# Patient Record
Sex: Female | Born: 1940 | Race: Black or African American | Hispanic: No | Marital: Single | Smoking: Never smoker
Health system: Southern US, Community
[De-identification: ages and names within clinical notes are randomized; demographics above are authoritative.]

## PROBLEM LIST (undated history)

## (undated) DIAGNOSIS — I10 Essential (primary) hypertension: Secondary | ICD-10-CM

## (undated) DIAGNOSIS — E119 Type 2 diabetes mellitus without complications: Secondary | ICD-10-CM

---

## 2004-11-30 ENCOUNTER — Ambulatory Visit: Payer: Self-pay | Admitting: Ophthalmology

## 2004-12-07 ENCOUNTER — Ambulatory Visit: Payer: Self-pay | Admitting: Ophthalmology

## 2005-02-02 ENCOUNTER — Ambulatory Visit: Payer: Self-pay | Admitting: Ophthalmology

## 2005-02-08 ENCOUNTER — Ambulatory Visit: Payer: Self-pay | Admitting: Ophthalmology

## 2006-02-22 ENCOUNTER — Ambulatory Visit: Payer: Self-pay | Admitting: Family Medicine

## 2007-04-17 ENCOUNTER — Ambulatory Visit: Payer: Self-pay | Admitting: Family Medicine

## 2008-04-27 ENCOUNTER — Ambulatory Visit: Payer: Self-pay | Admitting: Family Medicine

## 2008-06-08 ENCOUNTER — Emergency Department: Payer: Self-pay | Admitting: Emergency Medicine

## 2009-01-29 ENCOUNTER — Emergency Department: Payer: Self-pay | Admitting: Emergency Medicine

## 2009-06-11 ENCOUNTER — Ambulatory Visit: Payer: Self-pay | Admitting: Family Medicine

## 2010-07-01 ENCOUNTER — Ambulatory Visit: Payer: Self-pay | Admitting: Family Medicine

## 2011-07-04 ENCOUNTER — Ambulatory Visit: Payer: Self-pay | Admitting: Obstetrics and Gynecology

## 2012-08-20 ENCOUNTER — Ambulatory Visit: Payer: Self-pay | Admitting: Family Medicine

## 2012-11-20 ENCOUNTER — Emergency Department: Payer: Self-pay

## 2012-11-20 ENCOUNTER — Ambulatory Visit: Payer: Self-pay | Admitting: Family Medicine

## 2013-11-11 ENCOUNTER — Ambulatory Visit: Payer: Self-pay | Admitting: Family Medicine

## 2013-12-17 IMAGING — MG MM CAD SCREENING MAMMO
1 series · 4 of 4 positions shown · non-contrast
Comparison: none

REASON FOR EXAM: SCR MAMMO NO ORDER
COMMENTS:

PROCEDURE:     MAM - MAM DGTL SCRN MAM NO ORDER W/CAD  - August 20, 2012  [DATE]
RESULT:     Breast are dense and nodular. No mass. No pathologic clustered
calcification. Exam stable from prior studies dating back to 08/22/2002. CAD
evaluation nonfocal.

[R CC · right · 4 of 4 slices shown]
[im 1/4]
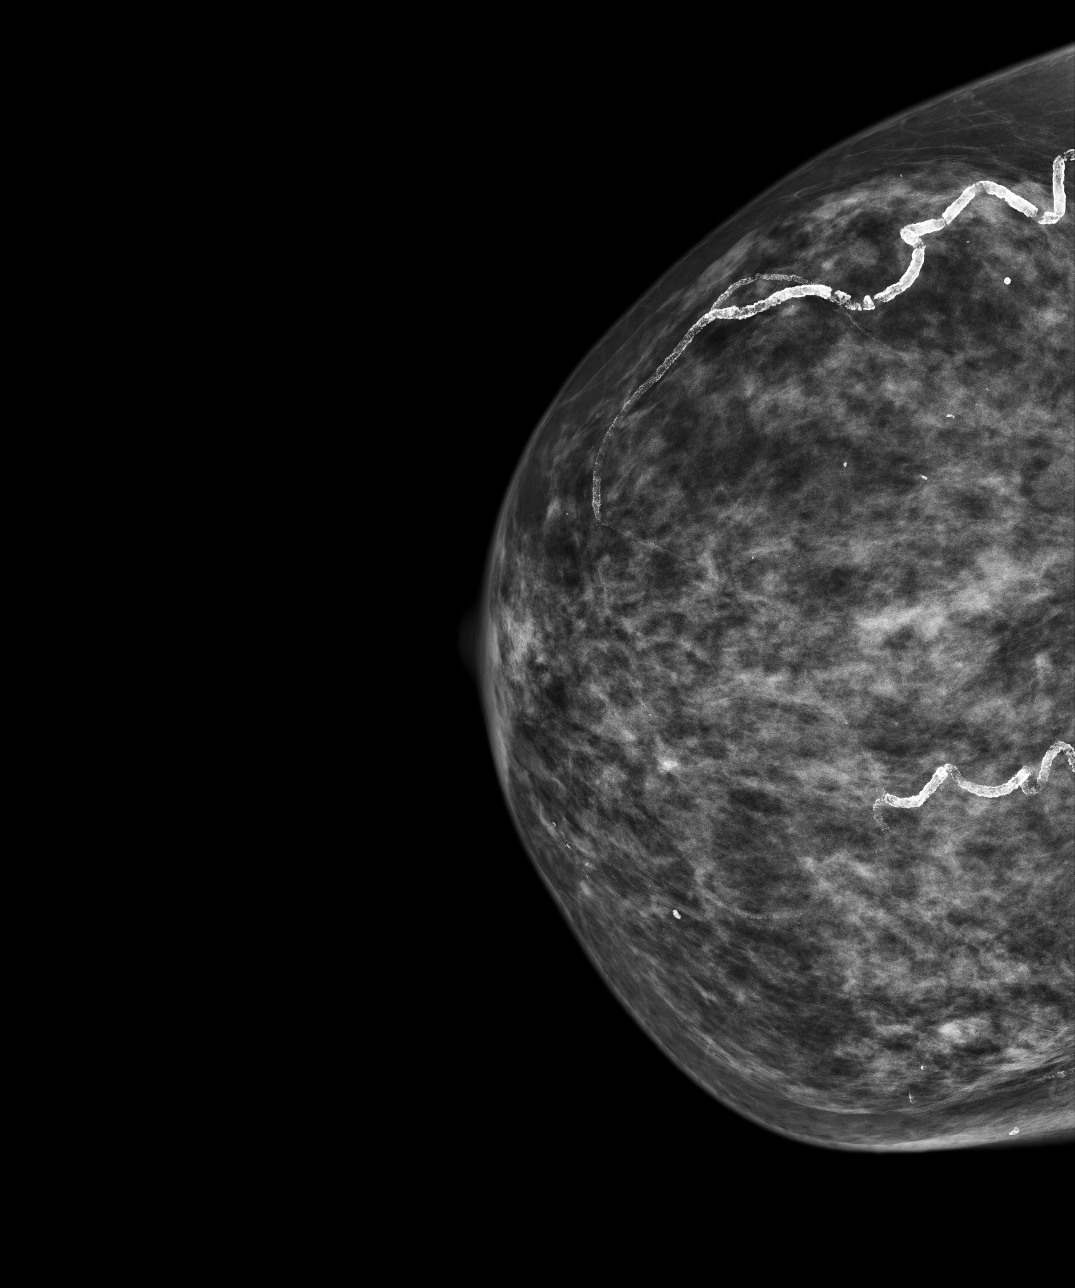
[im 2/4]
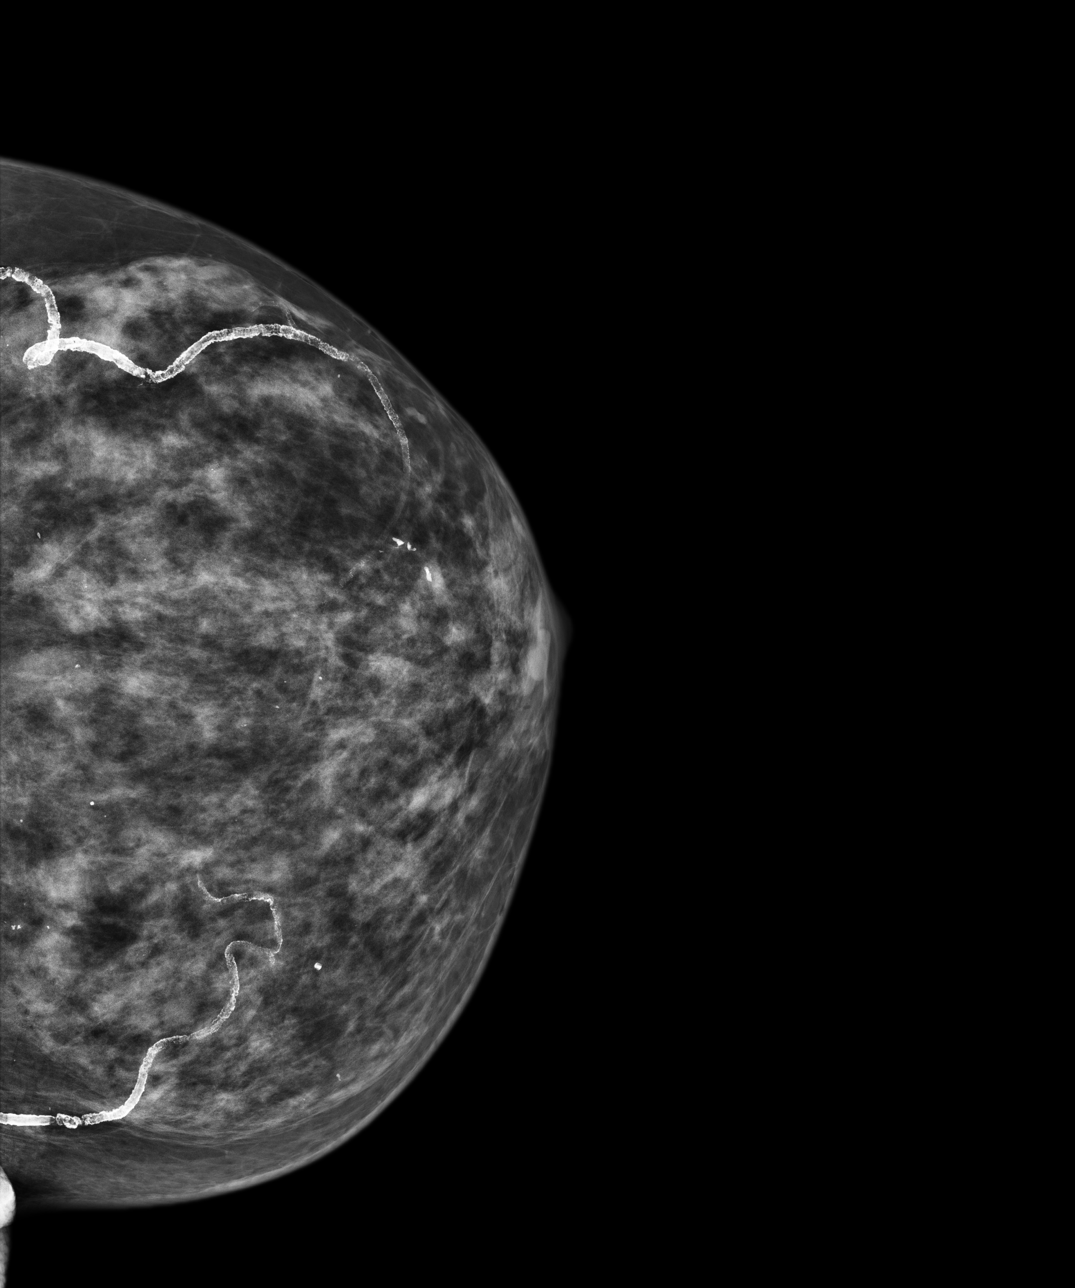
[im 3/4]
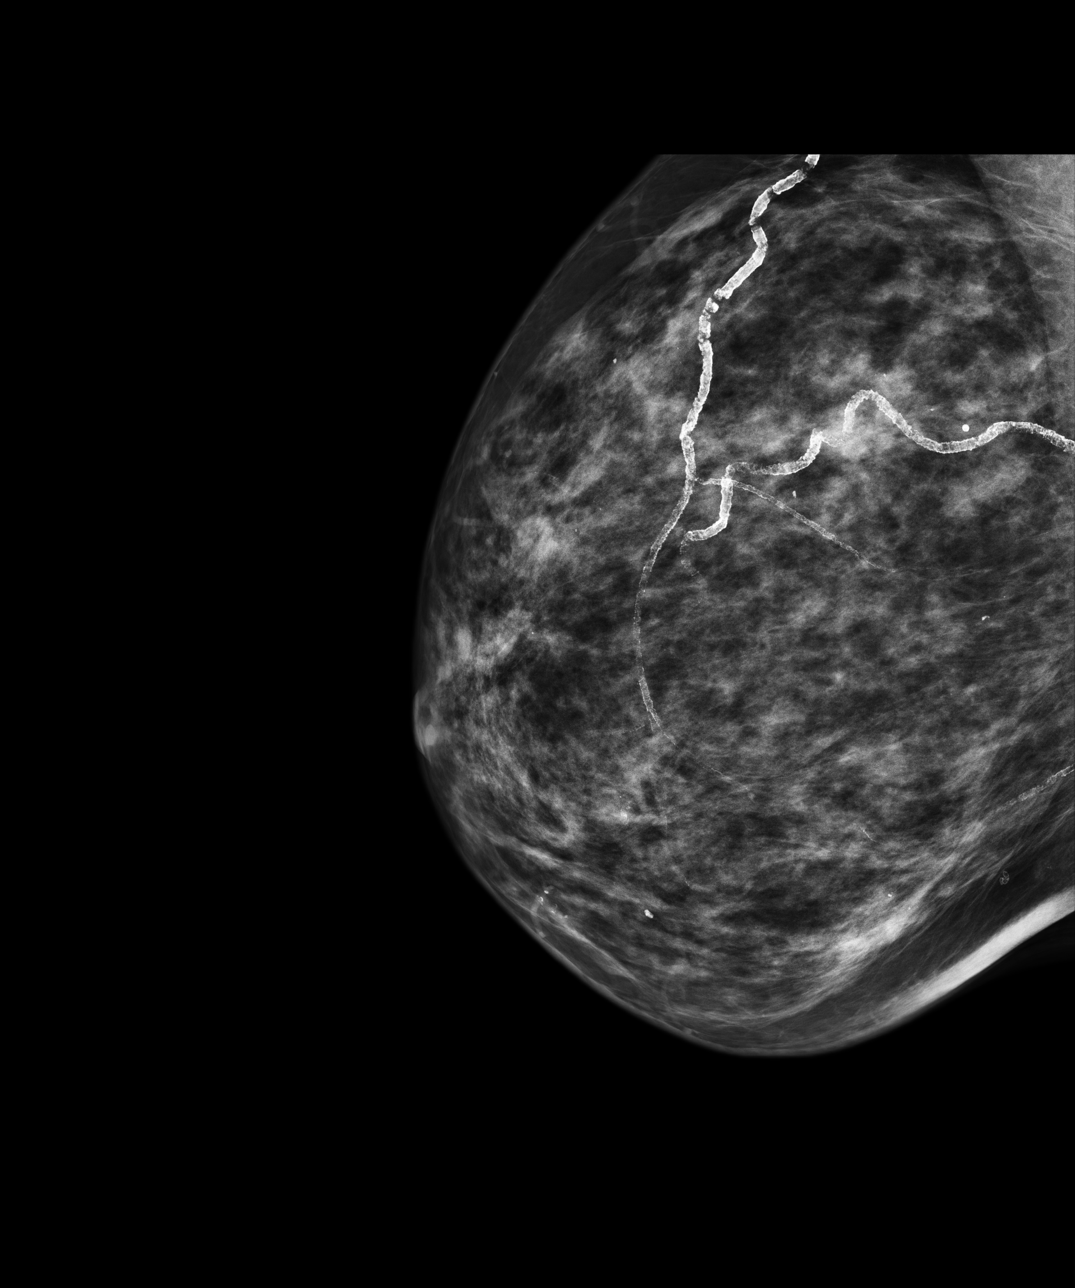
[im 4/4]
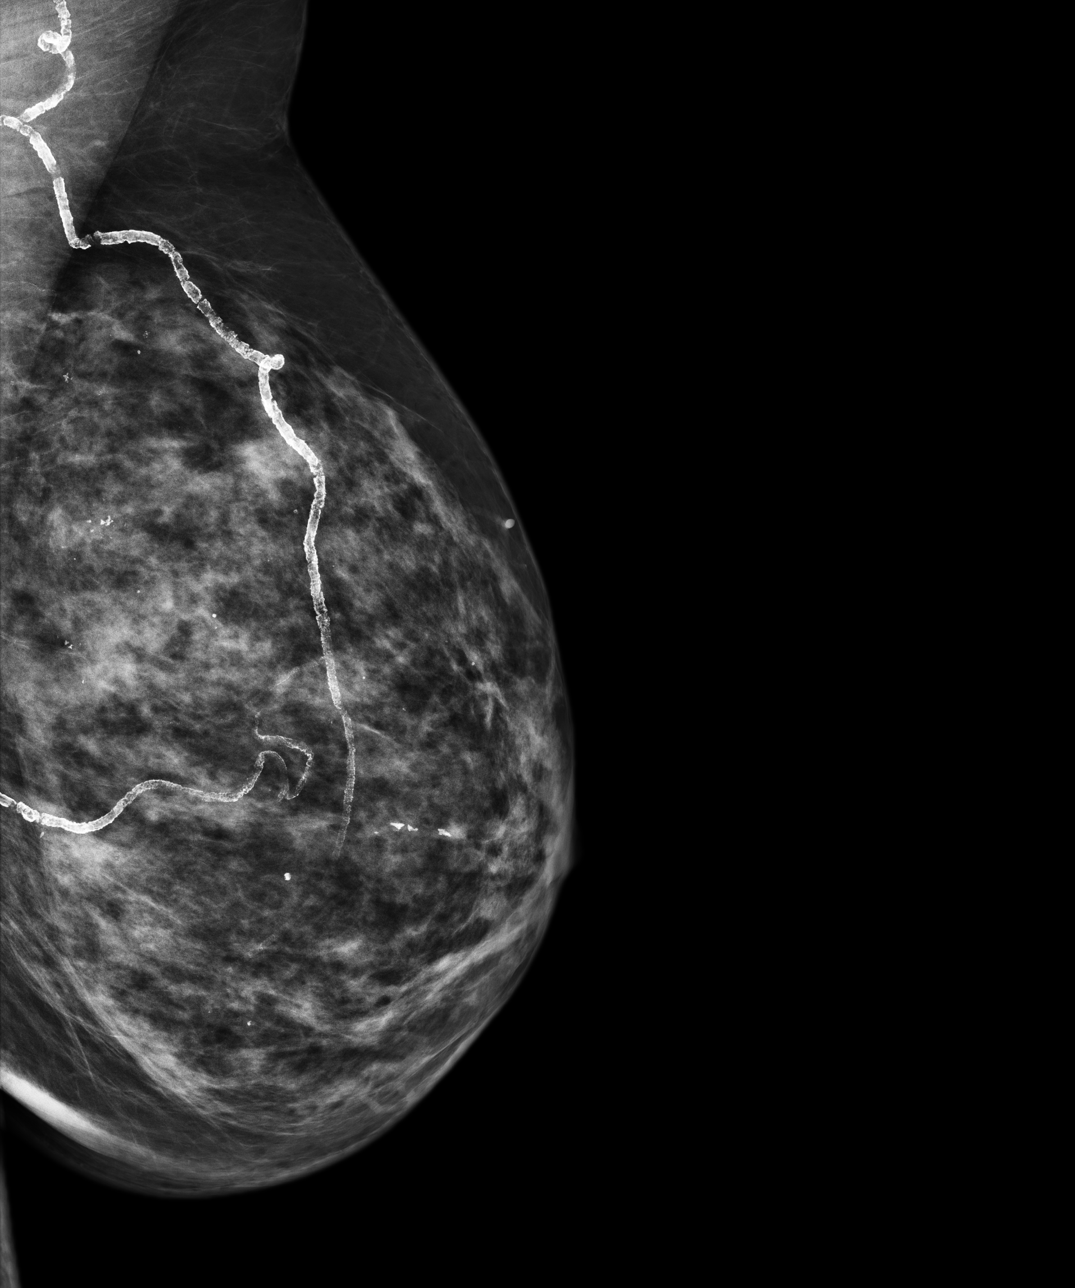

[4 of 4 positions shown; findings below may reference images not displayed]

IMPRESSION: Benign exam.

BI-RADS: Category 2- Benign Finding

A NEGATIVE MAMMOGRAM REPORT DOES NOT PRECLUDE BIOPSY OR OTHER EVALUATION OF
A CLINICALLY PALPABLE OR OTHERWISE SUSPICIOUS MASS OR LESION. BREAST CANCER
MAY NOT BE DETECTED IN UP TO 10% OF CASES.

## 2014-01-16 ENCOUNTER — Emergency Department: Payer: Self-pay | Admitting: Emergency Medicine

## 2014-12-02 ENCOUNTER — Ambulatory Visit: Payer: Self-pay | Admitting: Family Medicine

## 2017-12-12 ENCOUNTER — Other Ambulatory Visit: Payer: Self-pay

## 2017-12-12 ENCOUNTER — Ambulatory Visit: Payer: Medicare HMO | Attending: Internal Medicine | Admitting: Physical Therapy

## 2017-12-12 ENCOUNTER — Encounter: Payer: Self-pay | Admitting: Physical Therapy

## 2017-12-12 DIAGNOSIS — M6281 Muscle weakness (generalized): Secondary | ICD-10-CM | POA: Diagnosis present

## 2017-12-12 DIAGNOSIS — R262 Difficulty in walking, not elsewhere classified: Secondary | ICD-10-CM | POA: Diagnosis present

## 2017-12-12 DIAGNOSIS — M5442 Lumbago with sciatica, left side: Secondary | ICD-10-CM | POA: Diagnosis present

## 2017-12-12 NOTE — Therapy (Deleted)
[facility] [address] [place], [address] Phone: [phone]   Fax:  [phone]  Physical Therapy Treatment  Patient Details  Name: Latoya Cooper MRN: [id number] Date of Birth: 1941/02/29 No Data Recorded  Encounter Date: 12/12/2017    No past medical history on file.  *** The histories are not reviewed yet. Please review them in the "History" navigator section and refresh this SmartLink.  There were no vitals filed for this visit.    Medications: Current Outpatient Prescriptions  Medication Sig Dispense Refill  . aspirin (ECOTRIN) 81 MG tablet Take 81 mg by mouth daily.  . blood sugar diagnostic (GLUCOSE BLOOD) Strp 1 each by Other route daily as needed (blood sugar).  . carvedilol (COREG) 12.5 MG tablet Take 1 tablet (12.5 mg total) by mouth Two (2) times a day. 180 tablet 4  . cholecalciferol, vitamin D3, (VITAMIN D3) 2,000 unit tablet Take 1 tablet (2,000 Units total) by mouth daily. 1 each 0  . EPINEPHrine (EPIPEN 2-PAK) 0.3 mg/0.3 mL injection Inject 0.3 mL (0.3 mg total) into the muscle once. for 1 dose (Patient not taking: Reported on 10/25/2017) 1 each 2  . insulin glargine (LANTUS) 100 unit/mL (3 mL) injection pen Inject 0.08 mL (8 Units total) under the skin nightly. 15 mL prn  . metFORMIN (GLUCOPHAGE) 1000 MG tablet Take 1 tablet (1,000 mg total) by mouth 2 (two) times a day with meals. 180 tablet 4  . NIFEdipine (PROCARDIA XL) 60 MG 24 hr tablet Take 2 tablets (120 mg total) by mouth daily. 180 tablet 4  . olmesartan (BENICAR) 40 MG tablet Take 1 tablet (40 mg total) by mouth daily. 90 tablet 4  . ranitidine (ZANTAC) 150 MG tablet Take 1 tablet (150 mg total) by mouth Two (2) times a day. 180 tablet 4  . simvastatin (ZOCOR) 40 MG tablet Take 1 tablet (40 mg total) by mouth nightly. 90 tablet 4  . timolol (TIMOPTIC-XE) 0.5 % ophthalmic gel-forming Administer 1 drop to both eyes daily.  Marland Kitchen  torsemide (DEMADEX) 20 MG tablet Take 1 tablet (20 mg total) by mouth daily. 90 tablet 4  . travoprost (TRAVATAN) 0.004 % Drop Apply to eye.      PMHx: Active Ambulatory Problems  Diagnosis Date Noted  . Chronic right shoulder pain 02/14/2016  . Carpal tunnel syndrome, bilateral 02/14/2016  . Diabetic nephropathy associated with type 2 diabetes mellitus (CMS-HCC) 02/16/2016  . Chronic kidney disease, stage 2, mildly decreased GFR 02/16/2016  . Osteoarthritis of right glenohumeral joint 02/16/2016  . ASCUS on Pap smear 08/10/2017  . Essential hypertension 07/24/2014  . Glaucoma (increased eye pressure) 08/10/2017  . Secondary hyperparathyroidism of renal origin (CMS-HCC) 08/17/2015  . Elevated blood pressure reading in office with white coat syndrome, with diagnosis of hypertension 08/10/2017    Past Medical History:  Diagnosis Date  . CTS (carpal tunnel syndrome)  . Diabetes mellitus (CMS-HCC)  . Hyperlipidemia  . Hypertension   Social History   Social History Narrative  Lives in [place].  Granddaughter Pharmacist, hospital and two great grand sons Heloise Purpura and Thurston Pounds live with her.  Daughter is Marvia Pickles.  Son lives nearby, too.  Never smoker.  EtOH none.   Allergies: Allergies  Allergen Reactions  . Enalapril Swelling  . Morphine Sulfate Other (See Comments)  Became non responsive  Patient will benefit from skilled therapeutic intervention in order to improve the following deficits and impairments:     Visit Diagnosis: No diagnosis found.     Problem List There are no active problems to display for this patient.   Carl BestBrooks, Marie L 12/12/2017, 9:18 AM  [facility] [address] [place], [place], [address] Phone: [phone]   Fax:  [phone]  Name: Latoya Cooper MRN: [id number] Date of Birth: 10-12-1940

## 2017-12-12 NOTE — Therapy (Signed)
[facility] [address], [place], [address] Phone: [phone]   Fax:  [phone]  Physical Therapy Evaluation  Patient Details  Name: Latoya Cooper MRN: [id number] Date of Birth: March 22, 1941 Referring Provider: Steffanie Dunn Roseanne Reno), Shanda Bumps MD   Encounter Date: 12/12/2017  PT End of Session - 12/12/17 1015    Visit Number  1    Number of Visits  12    Date for PT Re-Evaluation  01/23/18    Authorization Type  1 of 6 FOTO    PT Start Time  0909    PT Stop Time  1010    PT Time Calculation (min)  61 min    Activity Tolerance  Patient tolerated treatment well    Behavior During Therapy  [facility] for tasks assessed/performed       History reviewed. No pertinent past medical history: see below  History reviewed. No pertinent surgical history: see below  There were no vitals filed for this visit.   Subjective Assessment - 12/12/17 0927    Subjective  Patient reports she has pain on waking on standing and walking. she takes tylenol to decrease pain and the pain progresses as the day goes on with standing and walking. she reports that sitting or lying down relieves her symptoms. patient denies bowel or bladder problems, numbness or weakness in LE's     Pertinent History  Patient reports she began having lower back pain November 2017 and got progressively worse with radiating symptoms into left LE to above the knee within the last 6 months.     Limitations  Standing;Walking;House hold activities;Other (comment) outdoor activities    How long can you sit comfortably?  as long as she wants    How long can you stand comfortably?  <30 min    How long can you walk comfortably?  short distances    Patient Stated Goals  patient would like to walk and go out in the yard with less difficulty/pain    Currently in Pain?  No/denies ranges from 0 - 10/10       Medications: Current Outpatient Prescriptions  Medication Sig  Dispense Refill  . aspirin (ECOTRIN) 81 MG tablet Take 81 mg by mouth daily.  . blood sugar diagnostic (GLUCOSE BLOOD) Strp 1 each by Other route daily as needed (blood sugar).  . carvedilol (COREG) 12.5 MG tablet Take 1 tablet (12.5 mg total) by mouth Two (2) times a day. 180 tablet 4  . cholecalciferol, vitamin D3, (VITAMIN D3) 2,000 unit tablet Take 1 tablet (2,000 Units total) by mouth daily. 1 each 0  . EPINEPHrine (EPIPEN 2-PAK) 0.3 mg/0.3 mL injection Inject 0.3 mL (0.3 mg total) into the muscle once. for 1 dose (Patient not taking: Reported on 10/25/2017) 1 each 2  . insulin glargine (LANTUS) 100 unit/mL (3 mL) injection pen Inject 0.08 mL (8 Units total) under the skin nightly. 15 mL prn  . metFORMIN (GLUCOPHAGE) 1000 MG tablet Take 1 tablet (1,000 mg total) by mouth 2 (two) times a day with meals. 180 tablet 4  . NIFEdipine (PROCARDIA XL) 60 MG 24 hr tablet Take 2 tablets (120 mg total) by mouth daily. 180 tablet 4  . olmesartan (BENICAR) 40 MG tablet Take 1 tablet (40 mg total) by mouth daily. 90 tablet 4  . ranitidine (ZANTAC) 150 MG tablet Take 1 tablet (150 mg total) by mouth Two (2) times a day. 180 tablet 4  . simvastatin (ZOCOR) 40  MG tablet Take 1 tablet (40 mg total) by mouth nightly. 90 tablet 4  . timolol (TIMOPTIC-XE) 0.5 % ophthalmic gel-forming Administer 1 drop to both eyes daily.  Marland Kitchen. torsemide (DEMADEX) 20 MG tablet Take 1 tablet (20 mg total) by mouth daily. 90 tablet 4  . travoprost (TRAVATAN) 0.004 % Drop Apply to eye.      PMHx: Active Ambulatory Problems  Diagnosis Date Noted  . Chronic right shoulder pain 02/14/2016  . Carpal tunnel syndrome, bilateral 02/14/2016  . Diabetic nephropathy associated with type 2 diabetes mellitus (CMS-HCC) 02/16/2016  . Chronic kidney disease, stage 2, mildly decreased GFR 02/16/2016  . Osteoarthritis of right glenohumeral joint 02/16/2016  . ASCUS on Pap smear 08/10/2017  . Essential hypertension 07/24/2014  . Glaucoma  (increased eye pressure) 08/10/2017  . Secondary hyperparathyroidism of renal origin (CMS-HCC) 08/17/2015  . Elevated blood pressure reading in office with white coat syndrome, with diagnosis of hypertension 08/10/2017    Past Medical History:  Diagnosis Date  . CTS (carpal tunnel syndrome)  . Diabetes mellitus (CMS-HCC)  . Hyperlipidemia  . Hypertension   Social History   Social History Narrative  Never smoker.  EtOH none.   Allergies: Allergies  Allergen Reactions  . Enalapril Swelling  . Morphine Sulfate Other (See Comments)  Became non responsive       [facility] PT Assessment - 12/12/17 0922      Assessment   Medical Diagnosis  low back pain with left sided sciatica    Referring Provider  [place] Roseanne Reno(Stewart), Shanda BumpsJessica MD    Onset Date/Surgical Date  08/02/16    Hand Dominance  Right    Prior Therapy  none      Precautions   Precautions  None      Balance Screen   Has the patient fallen in the past 6 months  No      Home Environment   Living Environment  Private residence    Living Arrangements  Children;Other relatives    Type of Home  Mobile home    Home Access  Stairs to enter    Entrance Stairs-Number of Steps  1    Entrance Stairs-Rails  None    Home Layout  One level      Prior Function   Level of Independence  Independent    Vocation  Retired    [employer]ocation Requirements  N/A    Leisure  spend time with family, outside yard, gardening      Cognition   Overall Cognitive Status  Within Functional Limits for tasks assessed      Observation/Other Assessments   Focus on Therapeutic Outcomes (FOTO)   49      Sensation   Light Touch  Appears Intact      ROM / Strength   AROM / PROM / Strength  AROM;Strength      AROM   Overall AROM Comments  lumbar flexion WNL without reproduction of sympotms, Extension limited 25% with reported increased left lower back symptoms, lateral flexion left and right WFL with reported increased pain with lateral flexion  right; LE's grossly WFL hip, knee and ankle       Strength   Overall Strength Comments  bilateral LE's grossly WFL and equal      Palpation   Palpation comment  spasms along left side lower back; decreased with prone lying over one pillow      FABER test   findings  Positive    Side  LEft  Comment   note: decreased ER left hip as compared to right      Slump test   Findings  Negative    Comment  mild reproduction of symtpoms left: symptoms improved following prone lying with ice pack x 10 min: no adverse reactions noted with ice      Prone Knee Bend Test   Findings  Negative   Side  Left          Straight Leg Raise   Findings  Negative    Comment  bilaterally; patient had more difficutly raising left LE improved control with compression of pelvis      Ambulation/Gait   Gait Comments  mild antalgic pattern with extended walking             Objective measurements completed on examination: See above findings.      PT Education - 12/12/17 1010    Education provided  Yes    Education Details  discusssed findings, results with FOTO, body mechanics, exercises for prone progression, use of heat/ice for pain control    Person(s) Educated  Patient    Methods  Explanation;Demonstration;Verbal cues;Handout for prone progression and body mechanics for daily activities, use of lumbar roll for sitting, log rolling for in/out of bed   Comprehension  Verbalized understanding;Returned demonstration;Verbal cues required          PT Long Term Goals - 12/12/17 1010      PT LONG TERM GOAL #1   Title  Patient will demonstrate improved function with decreased back symptoms as indicated by FOTO score of 60    Baseline  FOTO 49    Status  New    Target Date  01/23/18      PT LONG TERM GOAL #2   Title  Patient will be independent with home program for pain control and exercise progression for strength, and flexibility for self management when discharged from physical therapy     Baseline  limited knowledge of appropriate pain control strategies, exercises or progression without assistance, instruction    Status  New    Target Date  01/23/18             Plan - 12/12/17 1029    Clinical Impression Statement  Patient is a 77 year old female who presents with low back pain and left LE pain that has been worsening over the past 6 months. She is limited in daily activities with standing and walking and demonstrated decreased ROM in lumbar spine with extension and side bending right which reproduced left sided back symtpoms. She has FOTO score of 49 indicating moderate self perceived disability for daily tasks. She has no knowledge of appropriate pain control strategies or exercise progression in order to improve function and will benefit from physicla therapy intervention to address limitations and achieve goals.     History and Personal Factors relevant to plan of care:  Patient reports she began having lower back pain November 2017 and got progressively worse with radiating symptoms into left LE to above the knee within the last 6 months.     Clinical Presentation  Evolving    Clinical Presentation due to:  radiating symptoms into left LE that originated in lower back    Clinical Decision Making  Moderate    Rehab Potential  Good    Clinical Impairments Affecting Rehab Potential  (+)motivated, prior level of function (-) chronic condition, multiple co morbidities including HTN, arthritis, diabetes    PT Frequency  2x / week    PT Duration  6 weeks    PT Treatment/Interventions  Neuromuscular re-education;Cryotherapy;Electrical Stimulation;Ultrasound;Moist Heat;Therapeutic activities;Therapeutic exercise;Patient/family education;Manual techniques    PT Next Visit Plan  pain control, modalities as needed, exercise progression, Skalicky strengthening    PT Home Exercise Plan  positioning, exercise, use of heat/cold for pain control, body mechanics       Patient will benefit  from skilled therapeutic intervention in order to improve the following deficits and impairments:  Pain, Improper body mechanics, Increased muscle spasms, Impaired perceived functional ability, Decreased strength, Decreased range of motion, Decreased endurance, Decreased activity tolerance, Difficulty walking  Visit Diagnosis: Left-sided low back pain with left-sided sciatica, unspecified chronicity - Plan: PT plan of care cert/re-cert  Muscle weakness (generalized) - Plan: PT plan of care cert/re-cert  Difficulty in walking, not elsewhere classified - Plan: PT plan of care cert/re-cert     Problem List There are no active problems to display for this patient.   Beacher May PT 12/13/2017, 7:46 AM  [facility] SPORTS MEDICINE [address], [place], [address] Phone: [phone]   Fax:  [phone]  Name: Latoya Cooper MRN: [id number] Date of Birth: [birth year]

## 2017-12-20 ENCOUNTER — Ambulatory Visit: Payer: Medicare HMO | Admitting: Physical Therapy

## 2017-12-20 ENCOUNTER — Encounter: Payer: Self-pay | Admitting: Physical Therapy

## 2017-12-20 DIAGNOSIS — M5442 Lumbago with sciatica, left side: Secondary | ICD-10-CM | POA: Diagnosis not present

## 2017-12-20 DIAGNOSIS — M6281 Muscle weakness (generalized): Secondary | ICD-10-CM

## 2017-12-20 DIAGNOSIS — R262 Difficulty in walking, not elsewhere classified: Secondary | ICD-10-CM

## 2017-12-21 NOTE — Therapy (Signed)
[facility] [address], [place], [address] Phone: [phone]   Fax:  [phone]  Physical Therapy Treatment  Patient Details  Name: Latoya Cooper MRN: [id number] Date of Birth: 11/17/1940 Referring Provider: Steffanie Dunn Roseanne Reno), Shanda Bumps MD   Encounter Date: 12/20/2017  PT End of Session - 12/20/17 1439    Visit Number  2    Number of Visits  12    Date for PT Re-Evaluation  01/23/18    Authorization Type  2 of 6 FOTO    PT Start Time  1432    PT Stop Time  1513    PT Time Calculation (min)  41 min    Activity Tolerance  Patient tolerated treatment well    Behavior During Therapy  [facility] for tasks assessed/performed       History reviewed. No pertinent past medical history.  History reviewed. No pertinent surgical history.  There were no vitals filed for this visit.  Subjective Assessment - 12/20/17 1434    Subjective  Patient has good and bad days with back pain in the morning and yesterday she had no pain all day after taking Tylenol in the morning. Ice is helping with pain.     Pertinent History  Patient reports she began having lower back pain November 2017 and got progressively worse with radiating symptoms into left LE to above the knee within the last 6 months.     Limitations  Standing;Walking;House hold activities;Other (comment) outdoor activities    How long can you sit comfortably?  as long as she wants    How long can you stand comfortably?  <30 min    How long can you walk comfortably?  short distances    Patient Stated Goals  patient would like to walk and go out in the yard with less difficulty/pain    Currently in Pain?  Other (Comment) intermittent twitching in lower back with transferring sit to stand        Medications: Current Outpatient Prescriptions  Medication Sig Dispense Refill  . aspirin (ECOTRIN) 81 MG tablet Take 81 mg by mouth daily.  . blood sugar diagnostic (GLUCOSE  BLOOD) Strp 1 each by Other route daily as needed (blood sugar).  . carvedilol (COREG) 12.5 MG tablet Take 1 tablet (12.5 mg total) by mouth Two (2) times a day. 180 tablet 4  . cholecalciferol, vitamin D3, (VITAMIN D3) 2,000 unit tablet Take 1 tablet (2,000 Units total) by mouth daily. 1 each 0  . EPINEPHrine (EPIPEN 2-PAK) 0.3 mg/0.3 mL injection Inject 0.3 mL (0.3 mg total) into the muscle once. for 1 dose (Patient not taking: Reported on 10/25/2017) 1 each 2  . insulin glargine (LANTUS) 100 unit/mL (3 mL) injection pen Inject 0.08 mL (8 Units total) under the skin nightly. 15 mL prn  . metFORMIN (GLUCOPHAGE) 1000 MG tablet Take 1 tablet (1,000 mg total) by mouth 2 (two) times a day with meals. 180 tablet 4  . NIFEdipine (PROCARDIA XL) 60 MG 24 hr tablet Take 2 tablets (120 mg total) by mouth daily. 180 tablet 4  . olmesartan (BENICAR) 40 MG tablet Take 1 tablet (40 mg total) by mouth daily. 90 tablet 4  . ranitidine (ZANTAC) 150 MG tablet Take 1 tablet (150 mg total) by mouth Two (2) times a day. 180 tablet 4  . simvastatin (ZOCOR) 40 MG tablet Take 1 tablet (40 mg total) by mouth nightly. 90 tablet 4  . timolol (TIMOPTIC-XE)  0.5 % ophthalmic gel-forming Administer 1 drop to both eyes daily.  Marland Kitchen torsemide (DEMADEX) 20 MG tablet Take 1 tablet (20 mg total) by mouth daily. 90 tablet 4  . travoprost (TRAVATAN) 0.004 % Drop Apply to eye.     PMHx: Active Ambulatory Problems  Diagnosis Date Noted  . Chronic right shoulder pain 02/14/2016  . Carpal tunnel syndrome, bilateral 02/14/2016  . Diabetic nephropathy associated with type 2 diabetes mellitus (CMS-HCC) 02/16/2016  . Chronic kidney disease, stage 2, mildly decreased GFR 02/16/2016  . Osteoarthritis of right glenohumeral joint 02/16/2016  . ASCUS on Pap smear 08/10/2017  . Essential hypertension 07/24/2014  . Glaucoma (increased eye pressure) 08/10/2017  . Secondary hyperparathyroidism of renal origin (CMS-HCC) 08/17/2015  . Elevated  blood pressure reading in office with white coat syndrome, with diagnosis of hypertension 08/10/2017    Past Medical History:  Diagnosis Date  . CTS (carpal tunnel syndrome)  . Diabetes mellitus (CMS-HCC)  . Hyperlipidemia  . Hypertension   Social History   Social History Narrative  Never smoker.  EtOH none.   Allergies: Allergies  Allergen Reactions  . Enalapril Swelling  . Morphine Sulfate Other (See Comments)  Became non responsive    Objective: Posture: WNL  AROM: lumbar spine forward flexion WNL, extension <10% limited without increased pain; side bend right and left fingertips to knees without reproduction of symptoms Palpation: moderate spasms along left side lumbar spine  Treatment:  Manual therapy: 13 min. Goal : decrease spasms, improve mobility STM performed with superficial techniques along lumbar spine paraspinal muscles with patient prone lying with head and LE's supported Moist heat applied to lower back x 10 min in conjunction with ROM exercises:goal spasm; no adverse reactions noted with treatment  Therapeutic exercise: patient performed with instruction, verbal and tactile cues/facilitation of therapist: goal: improve strength, Fenter stability, decrease pain, improve function  prone x 5 min. Followed by prone on elbows x 2 min then LE hip extension 3 x 3 reps each LE with cuing to perform with correct technique and facilitation of lower back musculature for stabilization  Side lying:  Clamshells each LE x 10 reps each LE with facilitation of therapist for correct technique  Supine:  Hook lying ball between knees with hip adduction and glute sets: patient with increased lower back symptoms therefore discontinued  Controlled hip ER each for fall out through short arc with guidance of therapist for ROM without rotating pelvis 5 x each LE  Sitting: Hip adduction with ball and glute sets x 15, 3 second holds Hip abduction with manual resistance of  therapist x 10 reps Standing side stepping along counter x 2 min.  Patient response to treatment: patient demonstrated improved technique with exercises with minimal VC and facilitation for correct alignment and appropriate muscle activation. Patient with decreased feelings of twitching in back to 0/10 with sit to stand at end of session.  Patient with decreased spasms by 50% following STM. Improved motor control with repetition and cuing     PT Education - 12/20/17 1437    Education provided  Yes    Education Details  re assessed home program    Person(s) Educated  Patient    Methods  Explanation;Demonstration;Verbal cues    Comprehension  Verbalized understanding;Returned demonstration;Verbal cues required          PT Long Term Goals - 12/12/17 1010      PT LONG TERM GOAL #1   Title  Patient will demonstrate improved function with decreased  back symptoms as indicated by FOTO score of 60    Baseline  FOTO 49    Status  New    Target Date  01/23/18      PT LONG TERM GOAL #2   Title  Patient will be independent with home program for pain control and exercise progression for strength, and flexibility for self management when discharged from physical therapy    Baseline  limited knowledge of appropriate pain control strategies, exercises or progression without assistance, instruction    Status  New    Target Date  01/23/18            Plan - 12/20/17 1655    Clinical Impression Statement  Patient is progressing with improved ROM with no pain in back today on arrival and increased twitching in back with sit to stand. She responded well to treatment with STM and exercise. She continues with spasms in back and limitations with prolonged standing and walking and will benefit from continued physical therapy intervention.     Rehab Potential  Good    Clinical Impairments Affecting Rehab Potential  (+)motivated, prior level of function (-) chronic condition, multiple co morbidities  including HTN, arthritis, diabetes    PT Frequency  2x / week    PT Duration  6 weeks    PT Treatment/Interventions  Neuromuscular re-education;Cryotherapy;Electrical Stimulation;Ultrasound;Moist Heat;Therapeutic activities;Therapeutic exercise;Patient/family education;Manual techniques    PT Next Visit Plan  pain control, modalities as needed, exercise progression, Eckert strengthening, manual therapy STM    PT Home Exercise Plan  positioning, exercise, use of heat/cold for pain control, body mechanics; Nolasco strengthening       Patient will benefit from skilled therapeutic intervention in order to improve the following deficits and impairments:  Pain, Improper body mechanics, Increased muscle spasms, Impaired perceived functional ability, Decreased strength, Decreased range of motion, Decreased endurance, Decreased activity tolerance, Difficulty walking  Visit Diagnosis: Left-sided low back pain with left-sided sciatica, unspecified chronicity  Muscle weakness (generalized)  Difficulty in walking, not elsewhere classified     Problem List There are no active problems to display for this patient.   Beacher MayBrooks, Amelita Risinger PT 12/21/2017, 5:15 PM  [facility] [address] [place], [place], [address] Phone: [phone]   Fax:  [phone]  Name: Bryson DamesMable M Shidler MRN: [id number] Date of Birth: 03/26/1941

## 2017-12-25 ENCOUNTER — Ambulatory Visit: Payer: Medicare HMO | Admitting: Physical Therapy

## 2017-12-25 ENCOUNTER — Encounter: Payer: Self-pay | Admitting: Physical Therapy

## 2017-12-25 DIAGNOSIS — M6281 Muscle weakness (generalized): Secondary | ICD-10-CM

## 2017-12-25 DIAGNOSIS — M5442 Lumbago with sciatica, left side: Secondary | ICD-10-CM | POA: Diagnosis not present

## 2017-12-25 DIAGNOSIS — R262 Difficulty in walking, not elsewhere classified: Secondary | ICD-10-CM

## 2017-12-25 NOTE — Therapy (Signed)
[facility] [facility] [address] [place], [place], [address] Phone: [phone]   Fax:  [phone]  Physical Therapy Treatment  Patient Details  Name: Latoya Cooper MRN: [id number] Date of Birth: December 05, 1940 Referring Provider: Steffanie DunnPrestwood Roseanne Reno(Stewart), Shanda BumpsJessica MD   Encounter Date: 12/25/2017  PT End of Session - 12/25/17 0957    Visit Number  3    Number of Visits  12    Date for PT Re-Evaluation  01/23/18    Authorization Type  3 of 6 FOTO    PT Start Time  0950    PT Stop Time  1030    PT Time Calculation (min)  40 min    Activity Tolerance  Patient tolerated treatment well    Behavior During Therapy  [facility] for tasks assessed/performed       History reviewed. No pertinent past medical history.  History reviewed. No pertinent surgical history.  There were no vitals filed for this visit.  Subjective Assessment - 12/25/17 0954    Subjective  Patient reports she continues with pain in ber back with decreased intensity. She conitnues with Tylenol for pain control.     Pertinent History  Patient reports she began having lower back pain November 2017 and got progressively worse with radiating symptoms into left LE to above the knee within the last 6 months.     Limitations  Standing;Walking;House hold activities;Other (comment) outdoor activities    How long can you sit comfortably?  as long as she wants    How long can you stand comfortably?  <30 min    How long can you walk comfortably?  short distances    Patient Stated Goals  patient would like to walk and go out in the yard with less difficulty/pain    Currently in Pain?  Other (Comment) feels "a little" in left lower back, "not a hard pain"          Objective: BP pre exercise: 178/68 (patient reports her BP runs higher in doctors offices) SPO2 98%, HR 78 post treatment: BP162/78 (patient reported no dizziness at end of session) Posture: WNL  AROM: lumbar spine  forward flexion WNL, extension <10% limited without increased pain; side bend right and left fingertips to knees without reproduction of symptoms Palpation: mild to moderate spasms along left side lumbar spine   Treatment:  Manual therapy: 13 min. Goal : decrease spasms, improve mobility STM performed with superficial techniques along lumbar spine paraspinal muscles with patient prone lying with head and LE's supported   Therapeutic exercise: patient performed with instruction, verbal and tactile cues/facilitation of therapist: goal: improve strength, Arney stability, decrease pain, improve function   prone x 5 min. Followed by prone on elbows x 2 min then LE hip extension x 5 reps each LE with cuing to perform with correct technique and facilitation of lower back musculature for stabilization   Side lying:  Clamshells each LE x 10 reps each LE with facilitation of therapist for correct technique and mild manual resistance   Supine:  Hook lying ball under LE's 15 reps hip/knee flexion Controlled hip ER each for fall out through short arc with guidance of therapist for ROM without rotating pelvis 5 x each LE   Sitting: Hip adduction with ball and glute sets x 15, 3 second holds Hip abduction with green resistive band x 25 reps Standing side stepping along counter on airex balance beam x 2 min.   Patient response to  treatment: patient improved technique with exercises with moderate cuing and demonstration. Improved soft tissue elasticity with decreased spasms by 50% following STM    PT Education - 12/25/17 0956    Education provided  Yes    Education Details  HEP re assessed for prone progression and exercises    Person(s) Educated  Patient    Methods  Explanation;Verbal cues    Comprehension  Verbalized understanding          PT Long Term Goals - 12/12/17 1010      PT LONG TERM GOAL #1   Title  Patient will demonstrate improved function with decreased back symptoms as  indicated by FOTO score of 60    Baseline  FOTO 49    Status  New    Target Date  01/23/18      PT LONG TERM GOAL #2   Title  Patient will be independent with home program for pain control and exercise progression for strength, and flexibility for self management when discharged from physical therapy    Baseline  limited knowledge of appropriate pain control strategies, exercises or progression without assistance, instruction    Status  New    Target Date  01/23/18            Plan - 12/25/17 1100    Clinical Impression Statement  Patient is progressing steadily with goals. She continues with mild spasms in back that are responding well to treatment and she is progressing with exercises for Tsuchiya strengthening and control.     Rehab Potential  Good    Clinical Impairments Affecting Rehab Potential  (+)motivated, prior level of function (-) chronic condition, multiple co morbidities including HTN, arthritis, diabetes    PT Frequency  2x / week    PT Duration  6 weeks    PT Treatment/Interventions  Neuromuscular re-education;Cryotherapy;Electrical Stimulation;Ultrasound;Moist Heat;Therapeutic activities;Therapeutic exercise;Patient/family education;Manual techniques    PT Next Visit Plan  pain control, modalities as needed, exercise progression, Burbach strengthening, manual therapy STM    PT Home Exercise Plan  positioning, exercise, use of heat/cold for pain control, body mechanics; Amico strengthening       Patient will benefit from skilled therapeutic intervention in order to improve the following deficits and impairments:  Pain, Improper body mechanics, Increased muscle spasms, Impaired perceived functional ability, Decreased strength, Decreased range of motion, Decreased endurance, Decreased activity tolerance, Difficulty walking  Visit Diagnosis: Left-sided low back pain with left-sided sciatica, unspecified chronicity  Muscle weakness (generalized)  Difficulty in walking, not  elsewhere classified     Problem List There are no active problems to display for this patient.   Beacher May PT 12/26/2017, 4:36 PM  [facility] SPORTS MEDICINE [address], [place], [address] Phone: [phone]   Fax:  [phone]  Name: Latoya Cooper MRN: [id number] Date of Birth: [birth year]

## 2017-12-27 ENCOUNTER — Encounter: Payer: Self-pay | Admitting: Physical Therapy

## 2017-12-27 ENCOUNTER — Ambulatory Visit: Payer: Medicare HMO | Admitting: Physical Therapy

## 2017-12-27 DIAGNOSIS — R262 Difficulty in walking, not elsewhere classified: Secondary | ICD-10-CM

## 2017-12-27 DIAGNOSIS — M5442 Lumbago with sciatica, left side: Secondary | ICD-10-CM

## 2017-12-27 DIAGNOSIS — M6281 Muscle weakness (generalized): Secondary | ICD-10-CM

## 2017-12-27 NOTE — Therapy (Signed)
[facility] [address] [place], [address] Phone: [phone]   Fax:  [phone]  Physical Therapy Treatment  Patient Details  Name: Latoya Cooper MRN: [id number] Date of Birth: Mar 15, 1941 Referring Provider: Steffanie Dunn Roseanne Reno), Shanda Bumps MD   Encounter Date: 12/27/2017  PT End of Session - 12/27/17 0902    Visit Number  4    Number of Visits  12    Date for PT Re-Evaluation  01/23/18    Authorization Type  4 of 6 FOTO    PT Start Time  0820    PT Stop Time  0905    PT Time Calculation (min)  45 min    Activity Tolerance  Patient tolerated treatment well    Behavior During Therapy  [facility] for tasks assessed/performed       History reviewed. No pertinent past medical history.  History reviewed. No pertinent surgical history.  There were no vitals filed for this visit.  Subjective Assessment - 12/27/17 0823    Subjective  Patient reports chief concern is left lower back twinges with walking later in the day. Better for longer periods during the day since beginning therapy.     Pertinent History  Patient reports she began having lower back pain November 2017 and got progressively worse with radiating symptoms into left LE to above the knee within the last 6 months.     Limitations  Standing;Walking;House hold activities;Other (comment) outdoor activities    How long can you sit comfortably?  as long as she wants    How long can you stand comfortably?  <30 min    How long can you walk comfortably?  short distances    Patient Stated Goals  patient would like to walk and go out in the yard with less difficulty/pain    Currently in Pain?  Other (Comment) better with rest, worse at end of day "real bad" pain         Objective: BP pre exercise: 138/68 Posture: WNL  Gait: less guarded, improved trunk rotation   Treatment:  Therapeutic exercise: patient performed with instruction, verbal and tactile  cues/facilitation of therapist: goal: improve strength, Daponte stability, decrease pain, improve function   Standing: side stepping along counter on airex balance beam x 2 min. OMEGA palloff press foward facing 10# x 10, perpendicular 5# x 10 reps each side standing straight arm pull downs 10# x 10 standing SIJ mobilization with left LE behind right and right LE on chair: press into right LE x 5-10 reps: decreased left sided "twinge" to 0/10     Sitting: Hip adduction with ball and glute sets x 15, 3 second holds prone x 15 min. Followed by prone on elbows x 2 min (in conjunction with estim/heat)  Modalities: Electrical stimulation: High volt estim.clincial program for muscle spasms (4) electrodes applied to bilateral lower back intensity to tolerance with patient in reclined position with LE's supported on pillow goal: pain, spasms Moist heat applied to lower back during estim.; no adverse reactions noted  Patient response to treatment: patient improved technique with exercises with demonstration and repeated verbal cuing. decreased symptoms in left lower back, SIJ area to 0/10     PT Education - 12/27/17 0938    Education provided  Yes    Education Details  HEP: re assessed and added forward modified palloff press, scapular retraction with band, SIJ self mobilization standing at chair, left LE on floor    Person(s)  Educated  Patient    Methods  Explanation;Demonstration;Verbal cues;Handout    Comprehension  Verbal cues required;Returned demonstration;Verbalized understanding          PT Long Term Goals - 12/12/17 1010      PT LONG TERM GOAL #1   Title  Patient will demonstrate improved function with decreased back symptoms as indicated by FOTO score of 60    Baseline  FOTO 49    Status  New    Target Date  01/23/18      PT LONG TERM GOAL #2   Title  Patient will be independent with home program for pain control and exercise progression for strength, and flexibility for self  management when discharged from physical therapy    Baseline  limited knowledge of appropriate pain control strategies, exercises or progression without assistance, instruction    Status  New    Target Date  01/23/18            Plan - 12/27/17 0902    Clinical Impression Statement  Patient is progressing well towards goals with centralization of symptoms to back. She is still having pain in lower left back further into the day and less into left LE.  She is progressing exercises for Loomer and strength. She will benefit from additional physical therapy intervention to achieve goals.    Rehab Potential  Good    Clinical Impairments Affecting Rehab Potential  (+)motivated, prior level of function (-) chronic condition, multiple co morbidities including HTN, arthritis, diabetes    PT Frequency  2x / week    PT Duration  6 weeks    PT Treatment/Interventions  Neuromuscular re-education;Cryotherapy;Electrical Stimulation;Ultrasound;Moist Heat;Therapeutic activities;Therapeutic exercise;Patient/family education;Manual techniques    PT Next Visit Plan  pain control, modalities as needed, exercise progression, Fuelling strengthening, manual therapy STM    PT Home Exercise Plan  positioning, exercise, use of heat/cold for pain control, body mechanics; Quinley strengthening       Patient will benefit from skilled therapeutic intervention in order to improve the following deficits and impairments:  Pain, Improper body mechanics, Increased muscle spasms, Impaired perceived functional ability, Decreased strength, Decreased range of motion, Decreased endurance, Decreased activity tolerance, Difficulty walking  Visit Diagnosis: Left-sided low back pain with left-sided sciatica, unspecified chronicity  Muscle weakness (generalized)  Difficulty in walking, not elsewhere classified     Problem List There are no active problems to display for this patient.   Beacher MayBrooks, Marie PT 12/27/2017, 9:40 AM  [facility] [address] [place], [place], [address] Phone: [phone]   Fax:  [phone]  Name: Latoya Cooper MRN: [id number] Date of Birth: 26-Mar-1941

## 2017-12-31 ENCOUNTER — Encounter: Payer: Self-pay | Admitting: Physical Therapy

## 2017-12-31 ENCOUNTER — Ambulatory Visit: Payer: Medicare HMO | Attending: Internal Medicine | Admitting: Physical Therapy

## 2017-12-31 ENCOUNTER — Ambulatory Visit: Payer: Medicare HMO | Admitting: Physical Therapy

## 2017-12-31 DIAGNOSIS — M6281 Muscle weakness (generalized): Secondary | ICD-10-CM | POA: Diagnosis present

## 2017-12-31 DIAGNOSIS — M5442 Lumbago with sciatica, left side: Secondary | ICD-10-CM | POA: Insufficient documentation

## 2017-12-31 DIAGNOSIS — R262 Difficulty in walking, not elsewhere classified: Secondary | ICD-10-CM | POA: Diagnosis present

## 2017-12-31 NOTE — Therapy (Signed)
[facility] [address] [place], [address] Phone: [phone]   Fax:  [phone]  Physical Therapy Treatment  Patient Details  Name: Latoya Cooper MRN: [id number] Date of Birth: 22-Sep-1941 Referring Provider: Steffanie Dunn Roseanne Reno), Shanda Bumps MD   Encounter Date: 12/31/2017  PT End of Session - 12/31/17 1440    Visit Number  5    Number of Visits  12    Date for PT Re-Evaluation  01/23/18    Authorization Type  5 of 6 FOTO    PT Start Time  1430    PT Stop Time  1515    PT Time Calculation (min)  45 min    Activity Tolerance  Patient tolerated treatment well    Behavior During Therapy  [facility] for tasks assessed/performed       History reviewed. No pertinent past medical history.  History reviewed. No pertinent surgical history.  There were no vitals filed for this visit.  Subjective Assessment - 12/31/17 1436    Subjective  Patient reports chief concern is still having increased pain at end of day.     Pertinent History  Patient reports she began having lower back pain November 2017 and got progressively worse with radiating symptoms into left LE to above the knee within the last 6 months.     Limitations  Standing;Walking;House hold activities;Other (comment) outdoor activities    How long can you sit comfortably?  as long as she wants    How long can you stand comfortably?  <30 min    How long can you walk comfortably?  short distances    Patient Stated Goals  patient would like to walk and go out in the yard with less difficulty/pain    Currently in Pain?  No/denies feels a "twitch" pain in left lower back when getting up from a chair         Objective:   Treatment:  Therapeutic exercise: patient performed with instruction, verbal and tactile cues/facilitation of therapist: goal: improve strength, [place] stability, decrease pain, improve function   Standing: side stepping along counter with green  resistive band around thighs x 2 min  OMEGA palloff press foward facing 10# x 10, perpendicular 5# x 10 reps each side standing straight arm pull downs 10# x 10 Standing rows 10# x 15 reps  Standing: at wall: Bilateral pizza carry x 15 scaption bilateral x 15 Wall push ups x 10   Sitting: Hip adduction with ball and glute sets x 15, 3 second holds hip abduction x 15 with manual resistance of therapist   Prone:  Bilateral hip ER isometric heel squeeze x 10 reps, 3-5 second holds   Modalities: Electrical stimulation: High volt estim.clincial program for muscle spasms (4) electrodes applied to bilateral lower back intensity to tolerance with patient prone lying with LE's supported on pillow goal: pain, spasms Moist heat applied to lower back during estim.; no adverse reactions noted   Patient response to treatment: Improved technique with exercises with minimal cuing and repetition. No pain reported at end of session    PT Education - 12/31/17 1438    Education provided  Yes    Education Details  re assessed HEP with palloff press, SIJ mobilization; squeezing heels together in prone position    Person(s) Educated  Patient    Methods  Explanation;Demonstration;Verbal cues    Comprehension  Verbalized understanding;Returned demonstration;Verbal cues required  PT Long Term Goals - 12/12/17 1010      PT LONG TERM GOAL #1   Title  Patient will demonstrate improved function with decreased back symptoms as indicated by FOTO score of 60    Baseline  FOTO 49    Status  New    Target Date  01/23/18      PT LONG TERM GOAL #2   Title  Patient will be independent with home program for pain control and exercise progression for strength, and flexibility for self management when discharged from physical therapy    Baseline  limited knowledge of appropriate pain control strategies, exercises or progression without assistance, instruction    Status  New    Target Date  01/23/18             Plan - 12/31/17 1600    Clinical Impression Statement  Patient is progressing steadily towards goals and is responding well to current treatment for pain control and progressive exercises. She conitnues with increased low back pain as the day goes on and she will beneift from additional physical therapy intervention.    Rehab Potential  Good    Clinical Impairments Affecting Rehab Potential  (+)motivated, prior level of function (-) chronic condition, multiple co morbidities including HTN, arthritis, diabetes    PT Frequency  2x / week    PT Duration  6 weeks    PT Treatment/Interventions  Neuromuscular re-education;Cryotherapy;Electrical Stimulation;Ultrasound;Moist Heat;Therapeutic activities;Therapeutic exercise;Patient/family education;Manual techniques    PT Next Visit Plan  pain control, modalities as needed, exercise progression, [place] strengthening, manual therapy STM    PT Home Exercise Plan  positioning, exercise, use of heat/cold for pain control, body mechanics; [place] strengthening       Patient will benefit from skilled therapeutic intervention in order to improve the following deficits and impairments:  Pain, Improper body mechanics, Increased muscle spasms, Impaired perceived functional ability, Decreased strength, Decreased range of motion, Decreased endurance, Decreased activity tolerance, Difficulty walking  Visit Diagnosis: Left-sided low back pain with left-sided sciatica, unspecified chronicity  Muscle weakness (generalized)  Difficulty in walking, not elsewhere classified     Problem List There are no active problems to display for this patient.   Beacher MayBrooks, Marie PT 12/31/2017, 10:51 PM  [facility] [address] [place], [place], [address] Phone: [phone]   Fax:  [phone]  Name: Latoya Cooper MRN: [id number] Date of Birth: [birth year]

## 2018-01-02 ENCOUNTER — Ambulatory Visit: Payer: Medicare HMO | Admitting: Physical Therapy

## 2018-01-02 ENCOUNTER — Encounter: Payer: Self-pay | Admitting: Physical Therapy

## 2018-01-02 DIAGNOSIS — M5442 Lumbago with sciatica, left side: Secondary | ICD-10-CM

## 2018-01-02 DIAGNOSIS — M6281 Muscle weakness (generalized): Secondary | ICD-10-CM

## 2018-01-02 DIAGNOSIS — R262 Difficulty in walking, not elsewhere classified: Secondary | ICD-10-CM

## 2018-01-02 NOTE — Therapy (Signed)
[facility]ICINE [address] [place], [place], [address] Phone: [phone]   Fax:  [phone]  Physical Therapy Treatment  Patient Details  Name: Latoya DamesMable M Cooper MRN: [id number] Date of Birth: April 06, 1941 Referring Provider: Steffanie DunnPrestwood Roseanne Reno(Stewart), Shanda BumpsJessica MD   Encounter Date: 01/02/2018  PT End of Session - 01/02/18 1308    Visit Number  6    Number of Visits  12    Date for PT Re-Evaluation  01/23/18    Authorization Type  6 of 6 FOTO    PT Start Time  1305    PT Stop Time  1400    PT Time Calculation (min)  55 min    Activity Tolerance  Patient tolerated treatment well    Behavior During Therapy  [facility]/performed       History reviewed. No pertinent past medical history.  History reviewed. No pertinent surgical history.  There were no vitals filed for this visit.  Subjective Assessment - 01/02/18 1307    Subjective  patient continues with evening pain in back, overall better and mild pain at end of day on PT treatment days     Pertinent History  Patient reports she began having lower back pain November 2017 and got progressively worse with radiating symptoms into left LE to above the knee within the last 6 months.     Limitations  Standing;Walking;House hold activities;Other (comment) outdoor activities    How long can you sit comfortably?  as long as she wants    How long can you stand comfortably?  <30 min    How long can you walk comfortably?  short distances    Patient Stated Goals  patient would like to walk and go out in the yard with less difficulty/pain    Currently in Pain?  No/denies           Objective: palpation: spasm along left lumbar paraspinal muscles   Treatment:  Therapeutic exercise: patient performed with instruction, verbal and tactile cues/facilitation of therapist: goal: improve strength, [place] stability, decrease pain, improve function   Standing: side stepping along  counter with green resistive band around thighs x 2 min   OMEGA palloff press foward facing 10# x 10, perpendicular 5# x 10 reps each side standing straight arm pull downs 10# x 10 Standing rows 10# x 15 reps seated rows 7# x 10, 10# x 5 with increased effort reported with 10#   Standing: at wall: Bilateral pizza carry x 15 scaption bilateral x 15 Wall push ups x 15   Sitting: Hip adduction with ball and glute sets x 15, 3 second holds hip abduction x 15 with manual resistance of therapist    Prone:  Bilateral hip ER isometric heel squeeze x 10 reps, 3-5 second holds  Manual therapy: 5 min. Patient prone: STM superficial techniques to lumbar spine left > right side to improve spasms, pain   Modalities: Electrical stimulation: 15 min High volt estim.clincial program for muscle spasms (4) electrodes applied to bilateral lower back intensity to tolerance with patient prone lying with LE's supported on pillow goal: pain, spasms Moist heat applied to lower back during estim.; no adverse reactions noted   Patient response to treatment: improved spasms with less tenderness follwoing STM and estim. Improved technique with exercises with repeated demonstration and moderate cuing. No pain reported at end of session     PT Education - 01/02/18 1339    Education provided  Yes  Education Details  re assessed HEP with wall push ups and hip ER isometrics prone lying and hip extension prone    Person(s) Educated  Patient    Methods  Explanation;Demonstration;Verbal cues    Comprehension  Verbal cues required;Returned demonstration;Verbalized understanding          PT Long Term Goals - 01/02/18 1400      PT LONG TERM GOAL #1   Title  Patient will demonstrate improved function with decreased back symptoms as indicated by FOTO score of 60    Baseline  FOTO 49; 01/02/18 67    Status  Achieved      PT LONG TERM GOAL #2   Title  Patient will be independent with home program for pain  control and exercise progression for strength, and flexibility for self management when discharged from physical therapy    Baseline  limited knowledge of appropriate pain control strategies, exercises or progression without assistance, instruction    Status  On-going    Target Date  01/23/18      PT LONG TERM GOAL #3   Title  Patient will demonstrate improved function with decreased back symptoms as indicated by FOTO score of 75 and MODI of <20%    Baseline  FOTO 67; MODI 22%    Status  New    Target Date  01/23/18            Plan - 01/02/18 1340    Clinical Impression Statement  Patient is having good carry over between sessions with decreased intensity of pain later in the day and mild pain following sessions. She requires moderat cuing to perform exercises with proper positioning and intensity and will benefit from continued physical therapy interventions to achieve goals.     Rehab Potential  Good    Clinical Impairments Affecting Rehab Potential  (+)motivated, prior level of function (-) chronic condition, multiple co morbidities including HTN, arthritis, diabetes    PT Frequency  2x / week    PT Duration  6 weeks    PT Treatment/Interventions  Neuromuscular re-education;Cryotherapy;Electrical Stimulation;Ultrasound;Moist Heat;Therapeutic activities;Therapeutic exercise;Patient/family education;Manual techniques    PT Next Visit Plan  pain control, modalities as needed, exercise progression, [place] strengthening, manual therapy STM    PT Home Exercise Plan  positioning, exercise, use of heat/cold for pain control, body mechanics; [place] strengthening: resistive bands standing, wall push ups, pizza carry, scaption, prone hip ER isometrics, hip extension prone       Patient will benefit from skilled therapeutic intervention in order to improve the following deficits and impairments:  Pain, Improper body mechanics, Increased muscle spasms, Impaired perceived functional ability, Decreased  strength, Decreased range of motion, Decreased endurance, Decreased activity tolerance, Difficulty walking  Visit Diagnosis: Left-sided low back pain with left-sided sciatica, unspecified chronicity  Muscle weakness (generalized)  Difficulty in walking, not elsewhere classified     Problem List There are no active problems to display for this patient.   Beacher May PT 01/03/2018, 12:21 AM  [facility] PHYSICAL AND SPORTS MEDICINE [address] [place], [address] Phone: [phone]   Fax:  [phone]  Name: Latoya Cooper MRN: [id number] Date of Birth: [birth year]

## 2018-01-03 ENCOUNTER — Encounter: Payer: Medicare HMO | Admitting: Physical Therapy

## 2018-01-08 ENCOUNTER — Encounter: Payer: Self-pay | Admitting: Physical Therapy

## 2018-01-08 ENCOUNTER — Ambulatory Visit: Payer: Medicare HMO | Admitting: Physical Therapy

## 2018-01-08 DIAGNOSIS — M6281 Muscle weakness (generalized): Secondary | ICD-10-CM

## 2018-01-08 DIAGNOSIS — M5442 Lumbago with sciatica, left side: Secondary | ICD-10-CM | POA: Diagnosis not present

## 2018-01-08 DIAGNOSIS — R262 Difficulty in walking, not elsewhere classified: Secondary | ICD-10-CM

## 2018-01-08 NOTE — Therapy (Signed)
[facility] [address] [place], [place], [address] Phone: [phone]   Fax:  [phone]  Physical Therapy Treatment  Patient Details  Name: Latoya DamesMable M Cooper MRN: [id number] Date of Birth: 05/20/1941 Referring Provider: Steffanie DunnPrestwood Roseanne Reno(Stewart), Shanda BumpsJessica MD   Encounter Date: 01/08/2018  PT End of Session - 01/08/18 0917    Visit Number  7    Number of Visits  12    Date for PT Re-Evaluation  01/23/18    Authorization Type  1 of 6 FOTO    PT Start Time  0912    PT Stop Time  0942    PT Time Calculation (min)  30 min    Activity Tolerance  Patient tolerated treatment well    Behavior During Therapy  [facility] for tasks assessed/performed       History reviewed. No pertinent past medical history.  History reviewed. No pertinent surgical history.  There were no vitals filed for this visit.  Subjective Assessment - 01/08/18 0914    Subjective  patient reports decreased frequency and intensity of pain in back and left LE and not taking Tylenol regularly.     Pertinent History  Patient reports she began having lower back pain November 2017 and got progressively worse with radiating symptoms into left LE to above the knee within the last 6 months.     Limitations  Standing;Walking;House hold activities;Other (comment) outdoor activities    How long can you sit comfortably?  as long as she wants    How long can you stand comfortably?  30 min    How long can you walk comfortably?  intermediate distances    Patient Stated Goals  patient would like to walk and go out in the yard with less difficulty/pain    Currently in Pain?  No/denies         Objective: palpation: spasm along left lumbar paraspinal muscles   Treatment:  Therapeutic exercise: patient performed with instruction, verbal and tactile cues/facilitation of therapist: goal: improve strength, [place] stability, decrease pain, improve function   OMEGA palloff press foward  facing 10# x 15, perpendicular 5# x 10 reps each side standing straight arm pull downs 10# x 10 Standing rows 10# x 15 reps seated rows 7# x 10, 10# x 5 with increased effort reported with 10#   Standing: at wall: Bilateral pizza carry x 15 scaption bilateral x 15 Wall push ups x 15   Sitting: Hip adduction with ball and glute sets x 15, 3 second holds hip abduction x 15 with manual resistance of therapist    Prone:  Bilateral hip ER isometric heel squeeze x 10 reps, 3-5 second holds   Manual therapy: 10 min. Patient prone: STM superficial techniques to lumbar spine left > right side to improve spasms, pain    Patient response to treatment: improved technique with exercises with repetition and minimal verbal cuing and demonstration. Improved soft tissue elasticity with decreased spasms by 50% following treatment           PT Education - 01/08/18 0928    Education provided  Yes    Education Details  re assessed HEP for technique    Person(s) Educated  Patient    Methods  Explanation;Verbal cues    Comprehension  Verbalized understanding;Verbal cues required          PT Long Term Goals - 01/02/18 1400      PT LONG TERM GOAL #1   Title  Patient will demonstrate improved function with decreased back symptoms as indicated by FOTO score of 60    Baseline  FOTO 49; 01/02/18 67    Status  Achieved      PT LONG TERM GOAL #2   Title  Patient will be independent with home program for pain control and exercise progression for strength, and flexibility for self management when discharged from physical therapy    Baseline  limited knowledge of appropriate pain control strategies, exercises or progression without assistance, instruction    Status  On-going    Target Date  01/23/18      PT LONG TERM GOAL #3   Title  Patient will demonstrate improved function with decreased back symptoms as indicated by FOTO score of 75 and MODI of <20%    Baseline  FOTO 67; MODI 22%    Status   New    Target Date  01/23/18            Plan - 01/08/18 0929    Clinical Impression Statement  limited session due to patient arriving late. She is progressing well towards goals with decreased intensity and frequency of symptoms in lower back and left LE and is taking less medication to control symptoms. she conitnues to require cuing for exercises and has spasms in back that continue to require physica therapy intervention.     Rehab Potential  Good    Clinical Impairments Affecting Rehab Potential  (+)motivated, prior level of function (-) chronic condition, multiple co morbidities including HTN, arthritis, diabetes    PT Frequency  2x / week    PT Duration  6 weeks    PT Treatment/Interventions  Neuromuscular re-education;Cryotherapy;Electrical Stimulation;Ultrasound;Moist Heat;Therapeutic activities;Therapeutic exercise;Patient/family education;Manual techniques    PT Next Visit Plan  pain control, modalities as needed, exercise progression, [place] strengthening, manual therapy STM    PT Home Exercise Plan  positioning, exercise, use of heat/cold for pain control, body mechanics; [place] strengthening: resistive bands standing, wall push ups, pizza carry, scaption, prone hip ER isometrics, hip extension prone       Patient will benefit from skilled therapeutic intervention in order to improve the following deficits and impairments:  Pain, Improper body mechanics, Increased muscle spasms, Impaired perceived functional ability, Decreased strength, Decreased range of motion, Decreased endurance, Decreased activity tolerance, Difficulty walking  Visit Diagnosis: Left-sided low back pain with left-sided sciatica, unspecified chronicity  Muscle weakness (generalized)  Difficulty in walking, not elsewhere classified     Problem List There are no active problems to display for this patient.   Beacher May PT 01/09/2018, 9:32 AM  [facility] PHYSICAL  AND SPORTS MEDICINE [address], [place], [address] Phone: [phone]   Fax:  [phone]  Name: Milanya Sunderland Batz MRN: [id number] Date of Birth: [birth year]

## 2018-01-10 ENCOUNTER — Encounter: Payer: Self-pay | Admitting: Physical Therapy

## 2018-01-10 ENCOUNTER — Ambulatory Visit: Payer: Medicare HMO | Admitting: Physical Therapy

## 2018-01-10 DIAGNOSIS — M5442 Lumbago with sciatica, left side: Secondary | ICD-10-CM | POA: Diagnosis not present

## 2018-01-10 DIAGNOSIS — R262 Difficulty in walking, not elsewhere classified: Secondary | ICD-10-CM

## 2018-01-10 DIAGNOSIS — M6281 Muscle weakness (generalized): Secondary | ICD-10-CM

## 2018-01-11 NOTE — Therapy (Signed)
[facility] PHYSICAL AND SPORTS MEDICINE [address] [place], [place], [address] Phone: [phone]   Fax:  [phone]  Physical Therapy Treatment  Patient Details  Name: Latoya Cooper MRN: [id number] Date of Birth: 02-24-1941 Referring Provider: Steffanie DunnPrestwood Roseanne Reno(Stewart), Shanda BumpsJessica MD   Encounter Date: 01/10/2018  PT End of Session - 01/10/18 1422    Visit Number  8    Number of Visits  12    Date for PT Re-Evaluation  01/23/18    Authorization Type  2 of 6 FOTO    PT Start Time  1415    PT Stop Time  1508    PT Time Calculation (min)  53 min    Activity Tolerance  Patient tolerated treatment well    Behavior During Therapy  [facility] for tasks assessed/performed       History reviewed. No pertinent past medical history.  History reviewed. No pertinent surgical history.  There were no vitals filed for this visit.  Subjective Assessment - 01/10/18 1414    Subjective  Patient reports she conitnues to see progress and is not having to take any Tylenol for pain. She has maximal pain level at end of day of a 5/10.     Pertinent History  Patient reports she began having lower back pain November 2017 and got progressively worse with radiating symptoms into left LE to above the knee within the last 6 months.     Limitations  Standing;Walking;House hold activities;Other (comment) outdoor activities    How long can you sit comfortably?  as long as she wants    How long can you stand comfortably?  30 min    How long can you walk comfortably?  intermediate distances    Patient Stated Goals  patient would like to walk and go out in the yard with less difficulty/pain    Currently in Pain?  Other (Comment) mild discomfort lower back left side 3/10          Objective: palpation: mild spasms along left lumbar paraspinal muscles AROM: lumbar spine flexion WNL with mild stiffness end range, extension 25% decreased with mild discomfort left lower back, side bend  right and left WFL no reproduction of symptoms   Treatment:  Therapeutic exercise: patient performed with instruction, verbal and tactile cues/facilitation of therapist: goal: improve strength, Sima stability, decrease pain, improve function   OMEGA cable exercises: repeated VC and demonstration required for correct technique palloff press foward facing 10# x 15, perpendicular 5# x 10 reps each side standing straight arm pull downs 15# x 10 Standing rows 10# x 15 reps  prone lying: hip extension 2 x 5 reps each LE  standing:  self mobilization SIJ left with right LE on chair and left LE extended; 5 reps hold 5-10 seconds each repetition, VC required for correct technique side stepping along airex balance beam x 2 min with green resistive band around thighs Running man with green resistive band around thighs with patient standing on balance beam with one LE and using UE's for balance   Standing: at wall: Bilateral pizza carry x 15 scaption bilateral x 15 Wall push ups x 15    Manual therapy: 12 min. Patient prone: STM superficial techniques to lumbar spine left > right side to improve spasms, pain   Patient response to treatment: patient demonstrated improved technqiue with standing and strerngthening exercises following demonstration and with repeated VC. patient required UE support for standing balance exercises. improved soft tissue elasticity  by 75% with STM.      PT Education - 01/10/18 1420    Education provided  Yes    Education Details  exercise instruction; mattress/sleeping surface for comfort and to possibly decreased stiffness in the morning    Person(s) Educated  Patient    Methods  Explanation;Verbal cues    Comprehension  Verbalized understanding;Verbal cues required          PT Long Term Goals - 01/02/18 1400      PT LONG TERM GOAL #1   Title  Patient will demonstrate improved function with decreased back symptoms as indicated by FOTO score of 60     Baseline  FOTO 49; 01/02/18 67    Status  Achieved      PT LONG TERM GOAL #2   Title  Patient will be independent with home program for pain control and exercise progression for strength, and flexibility for self management when discharged from physical therapy    Baseline  limited knowledge of appropriate pain control strategies, exercises or progression without assistance, instruction    Status  On-going    Target Date  01/23/18      PT LONG TERM GOAL #3   Title  Patient will demonstrate improved function with decreased back symptoms as indicated by FOTO score of 75 and MODI of <20%    Baseline  FOTO 67; MODI 22%    Status  New    Target Date  01/23/18            Plan - 01/10/18 1533    Clinical Impression Statement  Patient demonstrates steady progress towards goals with patientt pain level max decreasing and patient using less pain medication. she continues to require demonstraion and verbal cuing to perform exercises with correct technqiue and posture. She will continue to benefit from additional physical hterapy intervention to achieve goals.     Rehab Potential  Good    Clinical Impairments Affecting Rehab Potential  (+)motivated, prior level of function (-) chronic condition, multiple co morbidities including HTN, arthritis, diabetes    PT Frequency  2x / week    PT Duration  6 weeks    PT Treatment/Interventions  Neuromuscular re-education;Cryotherapy;Electrical Stimulation;Ultrasound;Moist Heat;Therapeutic activities;Therapeutic exercise;Patient/family education;Manual techniques    PT Next Visit Plan  pain control, modalities as needed, exercise progression, [place]iz strengthening, manual therapy STM    PT Home Exercise Plan  positioning, exercise, use of heat/cold for pain control, body mechanics; Novosel strengthening: resistive bands standing, wall push ups, pizza carry, scaption, prone hip ER isometrics, hip extension prone       Patient will benefit from skilled therapeutic  intervention in order to improve the following deficits and impairments:  Pain, Improper body mechanics, Increased muscle spasms, Impaired perceived functional ability, Decreased strength, Decreased range of motion, Decreased endurance, Decreased activity tolerance, Difficulty walking  Visit Diagnosis: Left-sided low back pain with left-sided sciatica, unspecified chronicity  Muscle weakness (generalized)  Difficulty in walking, not elsewhere classified     Problem List There are no active problems to display for this patient.   Beacher May PT 01/11/2018, 9:16 AM  [place] [facility] PHYSICAL AND SPORTS MEDICINE [address] [place], [address] Phone: [phone]   Fax:  [phone]  Name: Latoya Cooper MRN: [id number] Date of Birth: [birth year]

## 2018-01-14 ENCOUNTER — Encounter: Payer: Medicare HMO | Admitting: Physical Therapy

## 2018-01-15 ENCOUNTER — Ambulatory Visit: Payer: Medicare HMO | Admitting: Physical Therapy

## 2018-01-15 ENCOUNTER — Ambulatory Visit
Admission: EM | Admit: 2018-01-15 | Discharge: 2018-01-15 | Disposition: A | Discharge status: Home / Self Care | Payer: Medicare HMO | Attending: Family Medicine | Admitting: Family Medicine

## 2018-01-15 ENCOUNTER — Other Ambulatory Visit: Payer: Self-pay

## 2018-01-15 ENCOUNTER — Encounter: Payer: Self-pay | Admitting: Emergency Medicine

## 2018-01-15 DIAGNOSIS — Z7982 Long term (current) use of aspirin: Secondary | ICD-10-CM | POA: Diagnosis not present

## 2018-01-15 DIAGNOSIS — R42 Dizziness and giddiness: Secondary | ICD-10-CM

## 2018-01-15 DIAGNOSIS — Z79899 Other long term (current) drug therapy: Secondary | ICD-10-CM | POA: Diagnosis not present

## 2018-01-15 DIAGNOSIS — E119 Type 2 diabetes mellitus without complications: Secondary | ICD-10-CM | POA: Insufficient documentation

## 2018-01-15 DIAGNOSIS — Z794 Long term (current) use of insulin: Secondary | ICD-10-CM | POA: Diagnosis not present

## 2018-01-15 DIAGNOSIS — I10 Essential (primary) hypertension: Secondary | ICD-10-CM | POA: Diagnosis not present

## 2018-01-15 DIAGNOSIS — Z9114 Patient's other noncompliance with medication regimen: Secondary | ICD-10-CM | POA: Diagnosis not present

## 2018-01-15 HISTORY — DX: Type 2 diabetes mellitus without complications: E11.9

## 2018-01-15 HISTORY — DX: Essential (primary) hypertension: I10

## 2018-01-15 LAB — GLUCOSE, CAPILLARY: Glucose-Capillary: 186 mg/dL — ABNORMAL HIGH (ref 65–99)

## 2018-01-15 NOTE — ED Triage Notes (Signed)
Patient c/o dizziness that started today.

## 2018-01-15 NOTE — ED Provider Notes (Signed)
[facility]    CSN: [id number] Arrival date & time: 01/15/18  1459     History   Chief Complaint Chief Complaint  Patient presents with  . Dizziness    HPI Latoya Cooper is a 77 y.o. female.    77 yo female with a c/o an episode of dizziness today. States currently not feeling dizzy. Episode earlier today lasted a few minutes and felt like lightheadedness. Patient denies vision changes, numbness/tingling, one-sided weakness, fevers, chills, chest pain, palpitations. States she had been out of her blood pressure medication but got it refilled last Friday (however still has not taken it and unable to explain why she hasn't taken it).   The history is provided by the patient.  Dizziness    Past Medical History:  Diagnosis Date  . Diabetes mellitus without complication (HCC)   . Hypertension     There are no active problems to display for this patient.   History reviewed. No pertinent surgical history.  OB History   None      Home Medications    Prior to Admission medications   Medication Sig Start Date End Date Taking? Authorizing Provider  carvedilol (COREG) 12.5 MG tablet Take by mouth. 08/10/17  Yes [provider]  Cholecalciferol (VITAMIN D3 SUPER STRENGTH) 2000 units TABS Take by mouth. 08/22/17  Yes [provider]  fexofenadine (ALLEGRA) 180 MG tablet 1 tab by mouth daily PRN ALLERGIES, congestion or dizziness 05/24/09  Yes [provider]  Insulin Glargine (LANTUS SOLOSTAR) 100 UNIT/ML Solostar Pen Inject into the skin. 08/10/17 08/10/18 Yes [provider]  metFORMIN (GLUCOPHAGE) 1000 MG tablet Take by mouth. 08/10/17 08/10/18 Yes [provider]  NIFEdipine (PROCARDIA XL/ADALAT-CC) 60 MG 24 hr tablet Take by mouth. 08/10/17 08/10/18 Yes [provider]  ranitidine (ZANTAC) 150 MG capsule Take by mouth. 03/08/15  Yes [provider]  aspirin EC 81 MG tablet Take by mouth.    [provider]  simvastatin (ZOCOR) 40 MG tablet  11/22/17   [provider]  timolol (TIMOPTIC-XR) 0.5 % ophthalmic gel-forming Administer 1 drop to both eyes daily.    [provider]  torsemide (DEMADEX) 20 MG tablet  11/06/17   [provider]    Family History History reviewed. No pertinent family history.  Social History Social History   Tobacco Use  . Smoking status: Never Smoker  . Smokeless tobacco: Never Used  Substance Use Topics  . Alcohol use: Never    Frequency: Never  . Drug use: Not on file     Allergies   Enalapril; Morphine; and Morphine sulfate   Review of Systems Review of Systems  Neurological: Positive for dizziness.     Physical Exam Triage Vital Signs ED Triage Vitals  Enc Vitals Group     BP 01/15/18 1513 (!) 177/80     Pulse Rate 01/15/18 1513 85     Resp 01/15/18 1513 16     Temp 01/15/18 1513 97.8 F (36.6 C)     Temp Source 01/15/18 1513 Oral     SpO2 01/15/18 1513 99 %     Weight 01/15/18 1511 122 lb (55.3 kg)     Height 01/15/18 1511 5\' 2"  (1.575 m)     Head Circumference --      Peak Flow --      Pain Score 01/15/18 1511 4     Pain Loc --      Pain Edu? --  Excl. in GC? --    No data found.  Updated Vital Signs BP (!) 177/80 (BP Location: Left Arm)   Pulse 85   Temp 97.8 F (36.6 C) (Oral)   Resp 16   Ht 5\' 2"  (1.575 m)   Wt 122 lb (55.3 kg)   SpO2 99%   BMI 22.31 kg/m   Visual Acuity Right Eye Distance:   Left Eye Distance:   Bilateral Distance:    Right Eye Near:   Left Eye Near:    Bilateral Near:     Physical Exam  Constitutional: She is oriented to person, place, and time. She appears well-developed and well-nourished. No distress.  HENT:  Head: Normocephalic.  Right Ear: Tympanic membrane, external ear and ear canal normal.  Left Ear: Tympanic membrane, external ear and ear canal normal.  Nose: Nose normal.  Mouth/Throat: Oropharynx is clear and moist and mucous  membranes are normal.  Eyes: Pupils are equal, round, and reactive to light. Conjunctivae and EOM are normal. Right eye exhibits no discharge. Left eye exhibits no discharge. No scleral icterus.  Neck: Normal range of motion. Neck supple. No JVD present. No tracheal deviation present. No thyromegaly present.  Cardiovascular: Normal rate, regular rhythm, normal heart sounds and intact distal pulses.  No murmur heard. Pulmonary/Chest: Effort normal and breath sounds normal. No stridor. No respiratory distress. She has no wheezes. She has no rales. She exhibits no tenderness.  Musculoskeletal: She exhibits no edema or tenderness.  Lymphadenopathy:    She has no cervical adenopathy.  Neurological: She is alert and oriented to person, place, and time. She has normal reflexes. She displays normal reflexes. No cranial nerve deficit or sensory deficit. She exhibits normal muscle tone. Coordination normal.  Skin: Skin is warm and dry. No rash noted. She is not diaphoretic. No erythema. No pallor.  Psychiatric: She has a normal mood and affect. Her behavior is normal. Judgment and thought content normal.  Vitals reviewed.    UC Treatments / Results  Labs (all labs ordered are listed, but only abnormal results are displayed) Labs Reviewed  GLUCOSE, CAPILLARY - Abnormal; Notable for the following components:      Result Value   Glucose-Capillary 186 (*)    All other components within normal limits  CBG MONITORING, ED    EKG None Radiology No results found.  Procedures Procedures (including critical care time)  Medications Ordered in UC Medications - No data to display   Initial Impression / Assessment and Plan / UC Course  I have reviewed the triage vital signs and the nursing notes.  Pertinent labs & imaging results that were available during my care of the patient were reviewed by me and considered in my medical decision making (see chart for details).      Final Clinical  Impressions(s) / UC Diagnoses   Final diagnoses:  Uncontrolled hypertension  Poor compliance with medication    ED Discharge Orders    None     1. diagnosis reviewed with patient and daughter 2. Recommend re-start blood pressure medicine right now (patient brought medicine with her) 3. Recommend supportive treatment with increase fluids; monitor 4. Follow up with PCP 5. Follow-up prn if symptoms worsen or don't improve  Controlled Substance Prescriptions [place] Controlled Substance Registry consulted? Not Applicable   Payton Mccallumonty, Bellah Alia, MD 01/15/18 [phone]

## 2018-01-15 NOTE — Discharge Instructions (Signed)
Restart blood pressure medication now

## 2018-01-15 NOTE — ED Triage Notes (Signed)
Patient states that has not taken her Benicar in about a month.

## 2018-01-17 ENCOUNTER — Ambulatory Visit: Payer: Medicare HMO | Admitting: Physical Therapy

## 2018-01-17 ENCOUNTER — Encounter: Payer: Self-pay | Admitting: Physical Therapy

## 2018-01-17 DIAGNOSIS — M6281 Muscle weakness (generalized): Secondary | ICD-10-CM

## 2018-01-17 DIAGNOSIS — M5442 Lumbago with sciatica, left side: Secondary | ICD-10-CM

## 2018-01-17 DIAGNOSIS — R262 Difficulty in walking, not elsewhere classified: Secondary | ICD-10-CM

## 2018-01-17 NOTE — Therapy (Signed)
[place] [facility] PHYSICAL AND SPORTS MEDICINE [address] [place], [place], [address] Phone: [phone]   Fax:  [phone]  Physical Therapy Treatment  Patient Details  Name: Latoya DamesMable M Morning MRN: [id number] Date of Birth: December 19, 1940 Referring Provider: Steffanie DunnPrestwood Roseanne Reno(Stewart), Shanda BumpsJessica MD   Encounter Date: 01/17/2018  PT End of Session - 01/17/18 1308    Visit Number  9    Number of Visits  12    Date for PT Re-Evaluation  01/23/18    Authorization Type  3 of 6 FOTO    PT Start Time  1300    PT Stop Time  1350    PT Time Calculation (min)  50 min    Activity Tolerance  Patient tolerated treatment well    Behavior During Therapy  [facility] for tasks assessed/performed       Past Medical History:  Diagnosis Date  . Diabetes mellitus without complication (HCC)   . Hypertension     History reviewed. No pertinent surgical history.  There were no vitals filed for this visit.  Subjective Assessment - 01/17/18 1306    Subjective  Patient reports she is doing well today with mild discomfort in left side lower back.     Pertinent History  Patient reports she began having lower back pain November 2017 and got progressively worse with radiating symptoms into left LE to above the knee within the last 6 months.     Limitations  Standing;Walking;House hold activities;Other (comment) outdoor activities    How long can you sit comfortably?  as long as she wants    How long can you stand comfortably?  30 min    How long can you walk comfortably?  intermediate distances    Patient Stated Goals  patient would like to walk and go out in the yard with less difficulty/pain    Currently in Pain?  Other (Comment) mild discomfort in left lower back       Objective: palpation: mild spasms along left lumbar paraspinal muscles Posture: WNL   Treatment:  Therapeutic exercise: patient performed with instruction, verbal and tactile cues/facilitation of therapist: goal:  improve strength, [place] stability, decrease pain, improve function   OMEGA cable exercises: repeated VC and demonstration required for correct technique palloff press foward facing 10# x 15, perpendicular 5# x 15 reps each side standing straight arm pull downs 10# 2 x 10 Standing rows 10# x 15 reps   standing:  self mobilization SIJ left with right LE on chair and left LE extended; 5 reps hold 5-10 seconds each repetition, VC required for correct technique side stepping along airex balance beam x 2 min with green resistive band around thighs Running man with green resistive band around thighs with patient standing on balance beam with one LE and using UE's for balance   Standing: at wall: Bilateral pizza carry x 15 with 2# dumbbells scaption bilateral x 15 with 2# dumbbells Wall push ups x 10   Manual therapy: 10 min. Patient prone: STM superficial techniques to lumbar spine left > right side to improve spasms, pain   Patient response to treatment: patient improved technique with exercises with minimal cuing and repeated demonstration. Patient required one hand/UE for support with standing exercise. Soft tissue elasticity improved up to 50% with STM.     PT Education - 01/17/18 1654    Education provided  Yes    Education Details  exercise instruction    Person(s) Educated  Patient  Methods  Explanation;Verbal cues    Comprehension  Verbalized understanding;Verbal cues required          PT Long Term Goals - 01/02/18 1400      PT LONG TERM GOAL #1   Title  Patient will demonstrate improved function with decreased back symptoms as indicated by FOTO score of 60    Baseline  FOTO 49; 01/02/18 67    Status  Achieved      PT LONG TERM GOAL #2   Title  Patient will be independent with home program for pain control and exercise progression for strength, and flexibility for self management when discharged from physical therapy    Baseline  limited knowledge of appropriate pain  control strategies, exercises or progression without assistance, instruction    Status  On-going    Target Date  01/23/18      PT LONG TERM GOAL #3   Title  Patient will demonstrate improved function with decreased back symptoms as indicated by FOTO score of 75 and MODI of <20%    Baseline  FOTO 67; MODI 22%    Status  New    Target Date  01/23/18            Plan - 01/17/18 1654    Clinical Impression Statement  Pateint demonstrates steady progress with goals. She continues with spasms and intermittent discomfort in left lower back that she is self managing with stretches, SIJ mobilization and exercises. She has not had left LE in 2 weeks and is not using Tylenol regularly for pain control. she conitnues to require moderate verbal cuing for correct posiitoning with exercises and will benefit from additional physical therapy intervention to achieve goals.     Rehab Potential  Good    Clinical Impairments Affecting Rehab Potential  (+)motivated, prior level of function (-) chronic condition, multiple co morbidities including HTN, arthritis, diabetes    PT Frequency  2x / week    PT Duration  6 weeks    PT Treatment/Interventions  Neuromuscular re-education;Cryotherapy;Electrical Stimulation;Ultrasound;Moist Heat;Therapeutic activities;Therapeutic exercise;Patient/family education;Manual techniques    PT Next Visit Plan  pain control, modalities as needed, exercise progression, [place] strengthening, manual therapy STM    PT Home Exercise Plan  positioning, exercise, use of heat/cold for pain control, body mechanics; [place] strengthening: resistive bands standing, wall push ups, pizza carry, scaption, prone hip ER isometrics, hip extension prone       Patient will benefit from skilled therapeutic intervention in order to improve the following deficits and impairments:  Pain, Improper body mechanics, Increased muscle spasms, Impaired perceived functional ability, Decreased strength, Decreased  range of motion, Decreased endurance, Decreased activity tolerance, Difficulty walking  Visit Diagnosis: Left-sided low back pain with left-sided sciatica, unspecified chronicity  Muscle weakness (generalized)  Difficulty in walking, not elsewhere classified     Problem List There are no active problems to display for this patient.   Beacher May PT 01/17/2018, 4:57 PM  [place] [facility] PHYSICAL AND SPORTS MEDICINE [address], [place], [address] Phone: [phone]   Fax:  [phone]  Name: Latangela Mccomas Houp MRN: [id number] Date of Birth: January 14, 1941

## 2018-01-21 ENCOUNTER — Encounter: Payer: Medicare HMO | Admitting: Physical Therapy

## 2018-01-23 ENCOUNTER — Ambulatory Visit: Payer: Medicare HMO | Admitting: Physical Therapy

## 2018-01-23 ENCOUNTER — Encounter: Payer: Self-pay | Admitting: Physical Therapy

## 2018-01-23 DIAGNOSIS — M6281 Muscle weakness (generalized): Secondary | ICD-10-CM

## 2018-01-23 DIAGNOSIS — R262 Difficulty in walking, not elsewhere classified: Secondary | ICD-10-CM

## 2018-01-23 DIAGNOSIS — M5442 Lumbago with sciatica, left side: Secondary | ICD-10-CM

## 2018-01-23 NOTE — Therapy (Signed)
[facility] [address] [place], [address] Phone: [phone]   Fax:  [phone]  Physical Therapy Treatment/Discharge Summary  Patient Details  Name: Latoya Cooper MRN: [id number] Date of Birth: 04/17/1941 Referring Provider: Steffanie Dunn Roseanne Reno), Shanda Bumps MD   Encounter Date: 01/23/2018   Patient began physical therapy on 12/12/2017 and has attended 10 sessions through 01/23/2018. She has achieved all goals and is independent in home program for continued self management of pain/symptoms and exercises as instructed. Plan discharge from physical therapy at this time.    PT End of Session - 01/23/18 1107    Visit Number  10    Number of Visits  12    Date for PT Re-Evaluation  01/23/18    Authorization Type  4 of 6 FOTO    PT Start Time  1102    PT Stop Time  1144    PT Time Calculation (min)  42 min    Activity Tolerance  Patient tolerated treatment well    Behavior During Therapy  WFL for tasks assessed/performed       Past Medical History:  Diagnosis Date  . Diabetes mellitus without complication (HCC)   . Hypertension     History reviewed. No pertinent surgical history.  There were no vitals filed for this visit.  Subjective Assessment - 01/23/18 1106    Subjective  Patient reports she is doing better in the evenings with less back discomfort and not having to take Tylenol as frequently. Patient would like to discharge from physical therapy and continue on her own.     Pertinent History  Patient reports she began having lower back pain November 2017 and got progressively worse with radiating symptoms into left LE to above the knee within the last 6 months.     Limitations  Standing;Walking;House hold activities;Other (comment) outdoor activities    How long can you sit comfortably?  as long as she wants    How long can you stand comfortably?  30 min    How long can you walk comfortably?   intermediate distances    Patient Stated Goals  patient would like to walk and go out in the yard with less difficulty/pain    Currently in Pain?  No/denies         Objective: AROM: lumbar spine WNL flexion/extension and side bending without reproduction of symptoms Strength: bilateral LE hip extension at least 4/5, abduction 4/5, ER 4/5 Posture: WNL FOTO score 83/100 (initially 49/100)    Treatment:  Therapeutic exercise: patient performed with instruction, verbal and tactile cues/facilitation of therapist: goal: improve strength, Stainback stability, decrease pain, improve function   OMEGA cable exercises: repeated VC and demonstration required for correct technique palloff press foward facing 10# x 15, perpendicular 5# x 15 reps each side standing straight arm pull downs 10#  x 15 Standing rows 10# x 15 reps   standing:  side stepping along airex balance beam x 2 min with blue resistive band around thighs Running man with blue resistive band around thighs with patient standing on balance beam with one LE and using UE's for balance 2 x 10 reps each LE diagonal walk   Standing: at wall: Bilateral pizza carry x 15 with 2# dumbbells scaption bilateral x 15 with 2# dumbbells Wall push ups x 10   Manual therapy: 10 min. Patient prone: STM superficial techniques to lumbar spine left > right side to improve spasms, pain   Patient  response to treatment: patient improved technique with exercises with minimal cuing and repeated demonstration. Patient required one hand/UE for support with standing exercise. Soft tissue elasticity improved up to 50% with STM.           PT Education - 01/23/18 1115    Education provided  Yes    Education Details  HEP re assessed    Person(s) Educated  Patient    Methods  Explanation;Demonstration;Verbal cues    Comprehension  Verbalized understanding;Returned demonstration;Verbal cues required          PT Long Term Goals - 01/23/18 1145       PT LONG TERM GOAL #1   Title  Patient will demonstrate improved function with decreased back symptoms as indicated by FOTO score of 60    Baseline  FOTO 49; 01/02/18 67    Status  Achieved      PT LONG TERM GOAL #2   Title  Patient will be independent with home program for pain control and exercise progression for strength, and flexibility for self management when discharged from physical therapy    Baseline  limited knowledge of appropriate pain control strategies, exercises or progression without assistance, instruction    Status  Achieved      PT LONG TERM GOAL #3   Title  Patient will demonstrate improved function with decreased back symptoms as indicated by FOTO score of 75 and MODI of <20%    Baseline  FOTO 67; MODI 22%; 01/23/18 FOTO 83    Status  Achieved            Plan - 01/23/18 1200    Clinical Impression Statement  Patient has achieved all goals and is independent with home program. Her FOTO score of 83/100 indicates mild self perceived impairment. She should continue to improve with self managment and home program.    Rehab Potential  Good    Clinical Impairments Affecting Rehab Potential  (+)motivated, prior level of function (-) chronic condition, multiple co morbidities including HTN, arthritis, diabetes    PT Frequency  2x / week    PT Duration  6 weeks    PT Treatment/Interventions  Neuromuscular re-education;Cryotherapy;Electrical Stimulation;Ultrasound;Moist Heat;Therapeutic activities;Therapeutic exercise;Patient/family education;Manual techniques    PT Next Visit Plan  discharge from physical therapy    PT Home Exercise Plan   body mechanics; Reust strengthening: resistive bands standing, wall push ups, pizza carry, scaption, prone hip ER isometrics, hip extension prone    Consulted and Agree with Plan of Care  Patient       Patient will benefit from skilled therapeutic intervention in order to improve the following deficits and impairments:  Pain, Improper body  mechanics, Increased muscle spasms, Impaired perceived functional ability, Decreased strength, Decreased range of motion, Decreased endurance, Decreased activity tolerance, Difficulty walking  Visit Diagnosis: Left-sided low back pain with left-sided sciatica, unspecified chronicity  Muscle weakness (generalized)  Difficulty in walking, not elsewhere classified     Problem List There are no active problems to display for this patient.   Beacher MayBrooks, Marie PT 01/24/2018, 1:20 PM  [facility] [address] [place], [place], [id number] Phone: [phone]   Fax:  [phone]  Name: Latoya Cooper MRN: [id number] Date of Birth: 1941/06/23

## 2018-01-24 ENCOUNTER — Ambulatory Visit: Payer: Medicare HMO | Admitting: Physical Therapy

## 2018-01-28 ENCOUNTER — Encounter: Payer: Medicare HMO | Admitting: Physical Therapy

## 2018-01-29 ENCOUNTER — Ambulatory Visit: Payer: Medicare HMO | Admitting: Physical Therapy

## 2018-01-31 ENCOUNTER — Encounter: Payer: Medicare HMO | Admitting: Physical Therapy

## 2018-02-12 ENCOUNTER — Encounter: Payer: Medicare HMO | Admitting: Physical Therapy

## 2018-02-14 ENCOUNTER — Encounter: Payer: Medicare HMO | Admitting: Physical Therapy

## 2018-02-19 ENCOUNTER — Encounter: Payer: Medicare HMO | Admitting: Physical Therapy

## 2018-02-21 ENCOUNTER — Encounter: Payer: Medicare HMO | Admitting: Physical Therapy

## 2018-02-26 ENCOUNTER — Encounter: Payer: Medicare HMO | Admitting: Physical Therapy

## 2018-02-28 ENCOUNTER — Encounter: Payer: Medicare HMO | Admitting: Physical Therapy
